# Patient Record
Sex: Female | Born: 1975 | Race: Black or African American | Hispanic: No | Marital: Single | Smoking: Never smoker
Health system: Southern US, Community
[De-identification: ages and names within clinical notes are randomized; demographics above are authoritative.]

## PROBLEM LIST (undated history)

## (undated) ENCOUNTER — Emergency Department (HOSPITAL_COMMUNITY): Admission: EM | Payer: Medicare Other | Source: Home / Self Care

## (undated) DIAGNOSIS — Z993 Dependence on wheelchair: Secondary | ICD-10-CM

## (undated) DIAGNOSIS — R159 Full incontinence of feces: Secondary | ICD-10-CM

## (undated) DIAGNOSIS — G7100 Muscular dystrophy, unspecified: Secondary | ICD-10-CM

## (undated) DIAGNOSIS — N1330 Unspecified hydronephrosis: Secondary | ICD-10-CM

## (undated) DIAGNOSIS — D259 Leiomyoma of uterus, unspecified: Secondary | ICD-10-CM

## (undated) DIAGNOSIS — N939 Abnormal uterine and vaginal bleeding, unspecified: Secondary | ICD-10-CM

## (undated) DIAGNOSIS — G809 Cerebral palsy, unspecified: Secondary | ICD-10-CM

## (undated) DIAGNOSIS — K219 Gastro-esophageal reflux disease without esophagitis: Secondary | ICD-10-CM

## (undated) DIAGNOSIS — E785 Hyperlipidemia, unspecified: Secondary | ICD-10-CM

## (undated) DIAGNOSIS — R32 Unspecified urinary incontinence: Secondary | ICD-10-CM

## (undated) DIAGNOSIS — H409 Unspecified glaucoma: Secondary | ICD-10-CM

## (undated) DIAGNOSIS — R7303 Prediabetes: Secondary | ICD-10-CM

## (undated) DIAGNOSIS — D649 Anemia, unspecified: Secondary | ICD-10-CM

## (undated) DIAGNOSIS — A419 Sepsis, unspecified organism: Secondary | ICD-10-CM

## (undated) DIAGNOSIS — F79 Unspecified intellectual disabilities: Secondary | ICD-10-CM

## (undated) DIAGNOSIS — J189 Pneumonia, unspecified organism: Secondary | ICD-10-CM

## (undated) DIAGNOSIS — Q369 Cleft lip, unilateral: Secondary | ICD-10-CM

## (undated) DIAGNOSIS — R569 Unspecified convulsions: Secondary | ICD-10-CM

## (undated) HISTORY — PX: MULTIPLE TOOTH EXTRACTIONS: SHX2053

## (undated) HISTORY — PX: NASAL SEPTUM SURGERY: SHX37

## (undated) HISTORY — PX: EYE SURGERY: SHX253

## (undated) HISTORY — PX: CLEFT PALATE REPAIR: SUR1165

---

## 2015-07-28 ENCOUNTER — Other Ambulatory Visit (HOSPITAL_COMMUNITY): Payer: Self-pay | Admitting: Specialist

## 2015-07-28 DIAGNOSIS — E270 Other adrenocortical overactivity: Principal | ICD-10-CM

## 2015-07-28 DIAGNOSIS — R7989 Other specified abnormal findings of blood chemistry: Secondary | ICD-10-CM

## 2015-08-05 ENCOUNTER — Encounter (HOSPITAL_COMMUNITY): Payer: Self-pay

## 2015-08-05 ENCOUNTER — Other Ambulatory Visit (HOSPITAL_COMMUNITY): Payer: Self-pay | Admitting: Specialist

## 2015-08-05 ENCOUNTER — Ambulatory Visit (HOSPITAL_COMMUNITY)
Admission: RE | Admit: 2015-08-05 | Discharge: 2015-08-05 | Disposition: A | Discharge status: Home / Self Care | Payer: Medicare Other | Source: Ambulatory Visit | Attending: Specialist | Admitting: Specialist

## 2015-08-05 DIAGNOSIS — E270 Other adrenocortical overactivity: Secondary | ICD-10-CM

## 2015-08-05 DIAGNOSIS — R7989 Other specified abnormal findings of blood chemistry: Secondary | ICD-10-CM

## 2015-08-05 DIAGNOSIS — R509 Fever, unspecified: Secondary | ICD-10-CM

## 2015-08-05 DIAGNOSIS — K8020 Calculus of gallbladder without cholecystitis without obstruction: Secondary | ICD-10-CM | POA: Diagnosis not present

## 2015-08-05 DIAGNOSIS — D3502 Benign neoplasm of left adrenal gland: Secondary | ICD-10-CM | POA: Insufficient documentation

## 2015-08-20 DIAGNOSIS — J189 Pneumonia, unspecified organism: Secondary | ICD-10-CM

## 2015-08-20 HISTORY — DX: Pneumonia, unspecified organism: J18.9

## 2015-08-27 ENCOUNTER — Encounter (HOSPITAL_COMMUNITY): Payer: Self-pay | Admitting: Emergency Medicine

## 2015-08-27 ENCOUNTER — Inpatient Hospital Stay (HOSPITAL_COMMUNITY)
Admission: EM | Admit: 2015-08-27 | Discharge: 2015-08-30 | DRG: 871 | Disposition: A | Discharge status: Home / Self Care | Payer: Medicare Other | Attending: Internal Medicine | Admitting: Internal Medicine

## 2015-08-27 ENCOUNTER — Emergency Department (HOSPITAL_COMMUNITY): Payer: Medicare Other

## 2015-08-27 DIAGNOSIS — D509 Iron deficiency anemia, unspecified: Secondary | ICD-10-CM | POA: Diagnosis present

## 2015-08-27 DIAGNOSIS — Z79899 Other long term (current) drug therapy: Secondary | ICD-10-CM

## 2015-08-27 DIAGNOSIS — N390 Urinary tract infection, site not specified: Secondary | ICD-10-CM | POA: Diagnosis not present

## 2015-08-27 DIAGNOSIS — D3502 Benign neoplasm of left adrenal gland: Secondary | ICD-10-CM | POA: Diagnosis present

## 2015-08-27 DIAGNOSIS — A419 Sepsis, unspecified organism: Principal | ICD-10-CM | POA: Diagnosis present

## 2015-08-27 DIAGNOSIS — E876 Hypokalemia: Secondary | ICD-10-CM | POA: Diagnosis present

## 2015-08-27 DIAGNOSIS — R7303 Prediabetes: Secondary | ICD-10-CM | POA: Diagnosis present

## 2015-08-27 DIAGNOSIS — Z7722 Contact with and (suspected) exposure to environmental tobacco smoke (acute) (chronic): Secondary | ICD-10-CM | POA: Diagnosis present

## 2015-08-27 DIAGNOSIS — N135 Crossing vessel and stricture of ureter without hydronephrosis: Secondary | ICD-10-CM | POA: Diagnosis present

## 2015-08-27 DIAGNOSIS — Z87898 Personal history of other specified conditions: Secondary | ICD-10-CM | POA: Diagnosis not present

## 2015-08-27 DIAGNOSIS — H409 Unspecified glaucoma: Secondary | ICD-10-CM | POA: Diagnosis present

## 2015-08-27 DIAGNOSIS — R509 Fever, unspecified: Secondary | ICD-10-CM

## 2015-08-27 DIAGNOSIS — J9600 Acute respiratory failure, unspecified whether with hypoxia or hypercapnia: Secondary | ICD-10-CM

## 2015-08-27 DIAGNOSIS — E785 Hyperlipidemia, unspecified: Secondary | ICD-10-CM | POA: Diagnosis present

## 2015-08-27 DIAGNOSIS — Q899 Congenital malformation, unspecified: Secondary | ICD-10-CM | POA: Diagnosis not present

## 2015-08-27 DIAGNOSIS — F79 Unspecified intellectual disabilities: Secondary | ICD-10-CM | POA: Diagnosis present

## 2015-08-27 DIAGNOSIS — E538 Deficiency of other specified B group vitamins: Secondary | ICD-10-CM | POA: Diagnosis present

## 2015-08-27 DIAGNOSIS — Z993 Dependence on wheelchair: Secondary | ICD-10-CM | POA: Diagnosis not present

## 2015-08-27 DIAGNOSIS — J9601 Acute respiratory failure with hypoxia: Secondary | ICD-10-CM | POA: Diagnosis present

## 2015-08-27 DIAGNOSIS — R32 Unspecified urinary incontinence: Secondary | ICD-10-CM | POA: Diagnosis present

## 2015-08-27 DIAGNOSIS — Z789 Other specified health status: Secondary | ICD-10-CM | POA: Insufficient documentation

## 2015-08-27 DIAGNOSIS — J189 Pneumonia, unspecified organism: Secondary | ICD-10-CM | POA: Diagnosis present

## 2015-08-27 DIAGNOSIS — R0902 Hypoxemia: Secondary | ICD-10-CM

## 2015-08-27 DIAGNOSIS — G9340 Encephalopathy, unspecified: Secondary | ICD-10-CM | POA: Diagnosis present

## 2015-08-27 DIAGNOSIS — Z87730 Personal history of (corrected) cleft lip and palate: Secondary | ICD-10-CM | POA: Diagnosis not present

## 2015-08-27 HISTORY — DX: Unspecified intellectual disabilities: F79

## 2015-08-27 HISTORY — DX: Unspecified glaucoma: H40.9

## 2015-08-27 LAB — CBC WITH DIFFERENTIAL/PLATELET
BASOS ABS: 0 10*3/uL (ref 0.0–0.1)
Basophils Relative: 0 %
EOS ABS: 0 10*3/uL (ref 0.0–0.7)
Eosinophils Relative: 0 %
HCT: 40.2 % (ref 36.0–46.0)
HEMOGLOBIN: 12.8 g/dL (ref 12.0–15.0)
LYMPHS PCT: 8 %
Lymphs Abs: 1.1 10*3/uL (ref 0.7–4.0)
MCH: 23.4 pg — ABNORMAL LOW (ref 26.0–34.0)
MCHC: 31.8 g/dL (ref 30.0–36.0)
MCV: 73.5 fL — AB (ref 78.0–100.0)
MONOS PCT: 10 %
Monocytes Absolute: 1.4 10*3/uL — ABNORMAL HIGH (ref 0.1–1.0)
NEUTROS ABS: 11 10*3/uL — AB (ref 1.7–7.7)
NEUTROS PCT: 82 %
PLATELETS: ADEQUATE 10*3/uL (ref 150–400)
RBC: 5.47 MIL/uL — AB (ref 3.87–5.11)
RDW: 22.4 % — ABNORMAL HIGH (ref 11.5–15.5)
WBC: 13.5 10*3/uL — ABNORMAL HIGH (ref 4.0–10.5)

## 2015-08-27 LAB — I-STAT CG4 LACTIC ACID, ED: Lactic Acid, Venous: 3.5 mmol/L (ref 0.5–2.0)

## 2015-08-27 LAB — COMPREHENSIVE METABOLIC PANEL
ALBUMIN: 4.4 g/dL (ref 3.5–5.0)
ALT: 34 U/L (ref 14–54)
AST: 45 U/L — AB (ref 15–41)
Alkaline Phosphatase: 95 U/L (ref 38–126)
Anion gap: 12 (ref 5–15)
BUN: 8 mg/dL (ref 6–20)
CHLORIDE: 103 mmol/L (ref 101–111)
CO2: 25 mmol/L (ref 22–32)
CREATININE: 0.96 mg/dL (ref 0.44–1.00)
Calcium: 10 mg/dL (ref 8.9–10.3)
GFR calc Af Amer: >60 mL/min (ref >60)
GLUCOSE: 228 mg/dL — AB (ref 65–99)
POTASSIUM: 4.2 mmol/L (ref 3.5–5.1)
SODIUM: 140 mmol/L (ref 135–145)
Total Bilirubin: 0.9 mg/dL (ref 0.3–1.2)
Total Protein: 8.3 g/dL — ABNORMAL HIGH (ref 6.5–8.1)

## 2015-08-27 MED ORDER — ACETAMINOPHEN 160 MG/5ML PO SOLN
650.0000 mg | Freq: Once | ORAL | Status: AC
  Administered 2015-08-27: 650 mg via ORAL
  Filled 2015-08-27: qty 20.3

## 2015-08-27 MED ORDER — SODIUM CHLORIDE 0.9 % IV BOLUS (SEPSIS)
500.0000 mL | Freq: Once | INTRAVENOUS | Status: AC
  Administered 2015-08-27: 500 mL via INTRAVENOUS

## 2015-08-27 MED ORDER — DEXTROSE 5 % IV SOLN
500.0000 mg | Freq: Once | INTRAVENOUS | Status: AC
  Administered 2015-08-28: 500 mg via INTRAVENOUS
  Filled 2015-08-27: qty 500

## 2015-08-27 MED ORDER — DEXTROSE 5 % IV SOLN
1.0000 g | Freq: Once | INTRAVENOUS | Status: AC
  Administered 2015-08-27: 1 g via INTRAVENOUS
  Filled 2015-08-27: qty 10

## 2015-08-27 NOTE — ED Notes (Signed)
Lactic Acid= 3.50, MD Audie Pinto notified

## 2015-08-27 NOTE — ED Notes (Signed)
MD made aware U bag collection unsuccessful.

## 2015-08-27 NOTE — ED Notes (Addendum)
To hold on blood cultures at this time per Dr. Audie Pinto.  U-bag placed on patient due to unsuccessful catheterization.  MD aware.

## 2015-08-27 NOTE — ED Notes (Addendum)
Per EMS patient here for abdominal pain.   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  Reports that patient's mother spoke to doctor today who stated she needed to come to ER for further evaluation.  Per EMS temp of 101 Tympanic.

## 2015-08-27 NOTE — ED Notes (Signed)
Bed: WA02 Expected date:  Expected time:  Means of arrival:  Comments: EMS 

## 2015-08-27 NOTE — ED Provider Notes (Signed)
CSN: [id number]     Arrival date & time 08/27/15  2020 History   First MD Initiated Contact with Patient 08/27/15 2041     Chief Complaint  Patient presents with  . Abdominal Pain      HPI Patient presents via EMS with a fever.  Patient has past medical history severe birth and congenital defects.. 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 Reports that patient's mother obtained this information from office today and stated office told her to come to ER if patient had fever. Per EMS temp of 101 .  Patient has not been eating or drinking as well recently. Past Medical History  Diagnosis Date  . Glaucoma   . Mental retardation    Past Surgical History  Procedure Laterality Date  . Cleft palate repair    . Eye surgery     History reviewed. No pertinent family history. Social History  Substance Use Topics  . Smoking status: Passive Smoke Exposure - Never Smoker  . Smokeless tobacco: None  . Alcohol Use: None   OB History    No data available     Review of Systems  Unable to perform ROS: Patient nonverbal  Constitutional: Positive for fever.      Allergies  Review of patient's allergies indicates no known allergies.  Home Medications   Prior to Admission medications   Medication Sig Start Date End Date Taking? Authorizing Provider  atropine 1 % ophthalmic solution Place 1 drop into the right eye at bedtime.   Yes Historical Provider, MD  ferrous sulfate 325 (65 FE) MG tablet Take 650 mg by mouth daily with breakfast.   Yes Historical Provider, MD  ibuprofen (ADVIL,MOTRIN) 100 MG/5ML suspension Take by mouth every 4 (four) hours as needed for mild pain. 81mL   Yes Historical Provider, MD  latanoprost (XALATAN) 0.005 % ophthalmic solution Place 1 drop into the left eye at bedtime. 02/18/15  Yes Historical Provider, MD  simvastatin (ZOCOR) 40 MG tablet Take 40 mg by mouth every evening.   Yes Historical Provider, MD  levofloxacin (LEVAQUIN) 500  MG tablet Take 1 tablet (500 mg total) by mouth daily. 08/30/15   Debbe Odea, MD  vitamin B-12 (CYANOCOBALAMIN) 1000 MCG tablet Take 1 tablet (1,000 mcg total) by mouth daily. 08/30/15   Debbe Odea, MD   BP 146/80 mmHg  Pulse 98  Temp(Src) 98.2 F (36.8 C) (Axillary)  Resp 20  Ht 4' (1.219 m)  Wt 65 lb 7.6 oz (29.7 kg)  BMI 19.99 kg/m2  SpO2 99%  LMP 08/13/2015 (Approximate) Physical Exam  Constitutional: No distress.  HENT:  Head: Atraumatic.  Neck: Normal range of motion.  Cardiovascular: Intact distal pulses.  Tachycardia present.   Pulmonary/Chest: No respiratory distress. She has no wheezes.  Abdominal: Normal appearance. She exhibits no distension. There is no tenderness. There is no rebound.  Musculoskeletal: Normal range of motion.  Neurological: She is alert. No cranial nerve deficit.  Skin: Skin is warm and dry. No rash noted.  Nursing note and vitals reviewed.   ED Course  Procedures (including critical care time) Medications  sodium chloride 0.9 % bolus 500 mL (500 mLs Intravenous Transfusing/Transfer 08/28/15 0028)  acetaminophen (TYLENOL) solution 650 mg (650 mg Oral Given 08/27/15 2222)  cefTRIAXone (ROCEPHIN) 1 g in dextrose 5 % 50 mL IVPB (0 g Intravenous Stopped 08/27/15 2353)  azithromycin (ZITHROMAX) 500 mg in dextrose 5 % 250 mL IVPB (500 mg Intravenous Transfusing/Transfer 08/28/15 0028)  sodium chloride  0.9 % bolus 1,000 mL (250 mLs Intravenous Given 08/28/15 0452)  vancomycin (VANCOCIN) 500 mg in sodium chloride 0.9 % 100 mL IVPB (500 mg Intravenous Given 08/28/15 0859)  sodium chloride 0.9 % bolus 1,000 mL (1,000 mLs Intravenous Given 08/28/15 1024)  potassium chloride 10 mEq in 100 mL IVPB (10 mEq Intravenous Given 08/28/15 1507)  vancomycin (VANCOCIN) 500 mg in sodium chloride 0.9 % 100 mL IVPB (500 mg Intravenous Given 08/29/15 0552)  cyanocobalamin ((VITAMIN B-12)) injection 1,000 mcg (1,000 mcg Subcutaneous Given 08/30/15 1007)    Labs Review Labs  Reviewed  CBC WITH DIFFERENTIAL/PLATELET - Abnormal; Notable for the following:    WBC 13.5 (*)    RBC 5.47 (*)    MCV 73.5 (*)    MCH 23.4 (*)    RDW 22.4 (*)    Neutro Abs 11.0 (*)    Monocytes Absolute 1.4 (*)    All other components within normal limits  COMPREHENSIVE METABOLIC PANEL - Abnormal; Notable for the following:    Glucose, Bld 228 (*)    Total Protein 8.3 (*)    AST 45 (*)    All other components within normal limits  LACTIC ACID, PLASMA - Abnormal; Notable for the following:    Lactic Acid, Venous 5.4 (*)    All other components within normal limits  TSH - Abnormal; Notable for the following:    TSH 4.776 (*)    All other components within normal limits  HEMOGLOBIN A1C - Abnormal; Notable for the following:    Hgb A1c MFr Bld 6.3 (*)    All other components within normal limits  COMPREHENSIVE METABOLIC PANEL - Abnormal; Notable for the following:    Potassium 3.4 (*)    Glucose, Bld 214 (*)    All other components within normal limits  CBC - Abnormal; Notable for the following:    WBC 11.4 (*)    Hemoglobin 11.6 (*)    MCV 74.2 (*)    MCH 22.7 (*)    RDW 22.5 (*)    All other components within normal limits  URINALYSIS W MICROSCOPIC (NOT AT [facility]) - Abnormal; Notable for the following:    Hgb urine dipstick TRACE (*)    Leukocytes, UA SMALL (*)    Bacteria, UA MANY (*)    Squamous Epithelial / LPF 0-5 (*)    All other components within normal limits  GLUCOSE, CAPILLARY - Abnormal; Notable for the following:    Glucose-Capillary 120 (*)    All other components within normal limits  T4, FREE - Abnormal; Notable for the following:    Free T4 1.35 (*)    All other components within normal limits  VANCOMYCIN, TROUGH - Abnormal; Notable for the following:    Vancomycin Tr <4 (*)    All other components within normal limits  CBC - Abnormal; Notable for the following:    WBC 10.6 (*)    Hemoglobin 9.9 (*)    HCT 31.7 (*)    MCV 73.0 (*)    MCH 22.8 (*)     RDW 22.3 (*)    All other components within normal limits  BASIC METABOLIC PANEL - Abnormal; Notable for the following:    Glucose, Bld 141 (*)    Calcium 7.7 (*)    All other components within normal limits  GLUCOSE, CAPILLARY - Abnormal; Notable for the following:    Glucose-Capillary 107 (*)    All other components within normal limits  T4, FREE - Abnormal; Notable  for the following:    Free T4 1.51 (*)    All other components within normal limits  VITAMIN B12 - Abnormal; Notable for the following:    Vitamin B-12 118 (*)    All other components within normal limits  IRON AND TIBC - Abnormal; Notable for the following:    Iron 15 (*)    TIBC 230 (*)    Saturation Ratios 7 (*)    All other components within normal limits  GLUCOSE, CAPILLARY - Abnormal; Notable for the following:    Glucose-Capillary 114 (*)    All other components within normal limits  I-STAT CG4 LACTIC ACID, ED - Abnormal; Notable for the following:    Lactic Acid, Venous 3.50 (*)    All other components within normal limits  CULTURE, BLOOD (ROUTINE X 2)  CULTURE, BLOOD (ROUTINE X 2)  URINE CULTURE  MRSA PCR SCREENING  CULTURE, EXPECTORATED SPUTUM-ASSESSMENT  GRAM STAIN  HIV ANTIBODY (ROUTINE TESTING)  STREP PNEUMONIAE URINARY ANTIGEN  LACTIC ACID, PLASMA  PROCALCITONIN  PROTIME-INR  APTT  OCCULT BLOOD X 1 CARD TO LAB, STOOL  INFLUENZA PANEL BY PCR (TYPE A & B, H1N1)  LACTIC ACID, PLASMA  TSH  FOLATE  FERRITIN  RETICULOCYTES  LEGIONELLA ANTIGEN, URINE  I-STAT CG4 LACTIC ACID, ED  I-STAT CG4 LACTIC ACID, ED  CG4 I-STAT (LACTIC ACID)    Imaging Review No results found. I have personally reviewed and evaluated these images and lab results as part of my medical decision-making.  We'll treat patient for community-acquired pneumonia because of radiographic findings.  Hospital is contacted and will come to evaluate for possible admission.  MDM   Final diagnoses:  Fever, unspecified fever  cause  Congenital abnormalities  CAP (community acquired pneumonia)        Leonard Schwartz, MD 08/31/15 1014

## 2015-08-27 NOTE — H&P (Signed)
[facility] History and Physical  Maria [name] [id number] DOB: 1976-04-06 DOA: 08/27/2015  Referring physician: Leonard Schwartz, MD PCP: Pcp Not In System   Chief Complaint: Fever  HPI: Maria [name] is a 39 y.o. female with prior history of MR HLD presents with fevers. 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 She is able to normally feed herself. Patient has been followed at [facility] for her medical problems. She had recently been having fevers and was referred to urology for urinary abnormalities. She had been having abdominal pain and fevers. 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 Urology ordered urine cultures and were going to review the CT for further workup. The mother states that she has not heard back from them yet. She has been having this fever months ago. She has not been treated with any antibiotics. Patient has not been her normal self according to her mother she is normally very easy going and happy person. She called the urologist today and was told to come to the ED because she had fevers. In the ED a CXR was done which shows possible pneumonia and also had an elevation of her lactate. She is being admitted for further treatment and workup.   Review of Systems:  Patient is not able to speak.   Past Medical History  Diagnosis Date  . Glaucoma   . Mental retardation    Past Surgical History  Procedure Laterality Date  . Cleft palate repair    . Eye surgery     Social History:  reports that she has been passively smoking.  She does not have any smokeless tobacco history on file. Her alcohol and drug histories are not on file.  No Known Allergies  History reviewed. No pertinent family history.   Prior to Admission medications   Medication Sig Start Date End Date Taking? Authorizing Provider    atropine 1 % ophthalmic solution Place 1 drop into the right eye at bedtime.   Yes Historical Provider, MD  ferrous sulfate 325 (65 FE) MG tablet Take 650 mg by mouth daily with breakfast.   Yes Historical Provider, MD  ibuprofen (ADVIL,MOTRIN) 100 MG/5ML suspension Take by mouth every 4 (four) hours as needed for mild pain. 76mL   Yes Historical Provider, MD  latanoprost (XALATAN) 0.005 % ophthalmic solution Place 1 drop into the left eye at bedtime. 02/18/15  Yes Historical Provider, MD  simvastatin (ZOCOR) 40 MG tablet Take 40 mg by mouth every evening.   Yes Historical Provider, MD   Physical Exam: Filed Vitals:   08/27/15 2044 08/27/15 2201 08/27/15 2225 08/27/15 2230  BP: 137/76  145/99 141/98  Pulse: 138  130 122  Temp: 100.2 F (37.9 C) 100.2 F (37.9 C)    TempSrc: Axillary Rectal    Resp: 18  20 20   Height: 4' (1.219 m)     Weight: 25.401 kg (56 lb)     SpO2: 100%  100% 100%    Wt Readings from Last 3 Encounters:  08/27/15 25.401 kg (56 lb)    General:  Appears anxious Eyes: unable to assess ENT: unable to assess Neck: no masses or thyromegaly Cardiovascular: RRR, no m/r/g Respiratory:  Normal respiratory effort.no rhonchi Skin: no rash or induration seen on limited exam Musculoskeletal: unable to assess Psychiatric: appears anxious limited by her mental state Neurologic: not able  to assess.          Labs on Admission:  Basic Metabolic Panel:  Recent Labs Lab 08/27/15 2126  NA 140  K 4.2  CL 103  CO2 25  GLUCOSE 228*  BUN 8  CREATININE 0.96  CALCIUM 10.0   Liver Function Tests:  Recent Labs Lab 08/27/15 2126  AST 45*  ALT 34  ALKPHOS 95  BILITOT 0.9  PROT 8.3*  ALBUMIN 4.4   No results for input(s): LIPASE, AMYLASE in the last 168 hours. No results for input(s): AMMONIA in the last 168 hours. CBC:  Recent Labs Lab 08/27/15 2126  WBC 13.5*  NEUTROABS 11.0*  HGB 12.8  HCT 40.2  MCV 73.5*  PLT PLATELET CLUMPS NOTED ON SMEAR, COUNT  APPEARS ADEQUATE   Cardiac Enzymes: No results for input(s): CKTOTAL, CKMB, CKMBINDEX, TROPONINI in the last 168 hours.  BNP (last 3 results) No results for input(s): BNP in the last 8760 hours.  ProBNP (last 3 results) No results for input(s): PROBNP in the last 8760 hours.  CBG: No results for input(s): GLUCAP in the last 168 hours.  Radiological Exams on Admission: Dg Chest Portable 1 View  08/27/2015  CLINICAL DATA:  Acute onset of generalized abdominal pain and fever. Initial encounter. EXAM: PORTABLE CHEST 1 VIEW COMPARISON:  None. FINDINGS: The lungs are mildly hypoexpanded. Mild vascular congestion is noted. Mild bibasilar opacities may reflect atelectasis or possibly mild pneumonia. There is no evidence of pleural effusion or pneumothorax. The cardiomediastinal silhouette is within normal limits. No acute osseous abnormalities are seen. IMPRESSION: Lungs mildly hypoexpanded. Mild vascular congestion noted. Mild bibasilar opacities may reflect atelectasis or possibly mild pneumonia. Electronically Signed   By: Garald Balding M.D.   On: 08/27/2015 22:43      Assessment/Plan Active Problems:   Fever   Sepsis ([place])   Mental retardation   1. Fever possible pneumonia -her CXR shows possible infiltrates will start on empiric antibiotics at this time -will get cultures if possible -will start on empiric therapy with rocephin/azithromycin -oxygen as needed  2. Possible Sepsis -started on sepsis protocol -follow up lactate -check procalcitonin -fluid resuscitation started in the ED -cultures ordered  3. Mental retardation -she is non-communicative  4. HLD -she is on simvastatin -check lipid panel  5. Pre-diabetes -will check A1C -monitor FSBS    Code Status: full code DVT Prophylaxis:heparin Family Communication: mother Disposition Plan: home    [facility] Pager [phone]

## 2015-08-27 NOTE — ED Notes (Signed)
Patient's mother reports she has been crying a lot and not eating.  Reports typically patient laughs a lot and has a good appetite.  Reports patient has not been drinking much either.

## 2015-08-28 ENCOUNTER — Inpatient Hospital Stay (HOSPITAL_COMMUNITY): Payer: Medicare Other

## 2015-08-28 DIAGNOSIS — J9601 Acute respiratory failure with hypoxia: Secondary | ICD-10-CM

## 2015-08-28 DIAGNOSIS — F79 Unspecified intellectual disabilities: Secondary | ICD-10-CM

## 2015-08-28 DIAGNOSIS — J189 Pneumonia, unspecified organism: Secondary | ICD-10-CM

## 2015-08-28 DIAGNOSIS — J9600 Acute respiratory failure, unspecified whether with hypoxia or hypercapnia: Secondary | ICD-10-CM

## 2015-08-28 DIAGNOSIS — Z789 Other specified health status: Secondary | ICD-10-CM | POA: Insufficient documentation

## 2015-08-28 DIAGNOSIS — R509 Fever, unspecified: Secondary | ICD-10-CM

## 2015-08-28 DIAGNOSIS — A419 Sepsis, unspecified organism: Principal | ICD-10-CM

## 2015-08-28 LAB — CBC
HCT: 37.9 % (ref 36.0–46.0)
Hemoglobin: 11.6 g/dL — ABNORMAL LOW (ref 12.0–15.0)
MCH: 22.7 pg — ABNORMAL LOW (ref 26.0–34.0)
MCHC: 30.6 g/dL (ref 30.0–36.0)
MCV: 74.2 fL — AB (ref 78.0–100.0)
PLATELETS: 183 10*3/uL (ref 150–400)
RBC: 5.11 MIL/uL (ref 3.87–5.11)
RDW: 22.5 % — AB (ref 11.5–15.5)
WBC: 11.4 10*3/uL — AB (ref 4.0–10.5)

## 2015-08-28 LAB — COMPREHENSIVE METABOLIC PANEL
ALK PHOS: 88 U/L (ref 38–126)
ALT: 28 U/L (ref 14–54)
AST: 29 U/L (ref 15–41)
Albumin: 3.8 g/dL (ref 3.5–5.0)
Anion gap: 10 (ref 5–15)
BILIRUBIN TOTAL: 0.7 mg/dL (ref 0.3–1.2)
BUN: 6 mg/dL (ref 6–20)
CALCIUM: 9.2 mg/dL (ref 8.9–10.3)
CHLORIDE: 104 mmol/L (ref 101–111)
CO2: 26 mmol/L (ref 22–32)
CREATININE: 0.78 mg/dL (ref 0.44–1.00)
GFR calc Af Amer: >60 mL/min (ref >60)
Glucose, Bld: 214 mg/dL — ABNORMAL HIGH (ref 65–99)
Potassium: 3.4 mmol/L — ABNORMAL LOW (ref 3.5–5.1)
Sodium: 140 mmol/L (ref 135–145)
Total Protein: 7.6 g/dL (ref 6.5–8.1)

## 2015-08-28 LAB — PROTIME-INR
INR: 1.12 (ref 0.00–1.49)
PROTHROMBIN TIME: 14.6 s (ref 11.6–15.2)

## 2015-08-28 LAB — PROCALCITONIN: PROCALCITONIN: 0.18 ng/mL

## 2015-08-28 LAB — LACTIC ACID, PLASMA
LACTIC ACID, VENOUS: 1.6 mmol/L (ref 0.5–2.0)
LACTIC ACID, VENOUS: 5.4 mmol/L — AB (ref 0.5–2.0)
Lactic Acid, Venous: 1 mmol/L (ref 0.5–2.0)

## 2015-08-28 LAB — APTT: aPTT: 27 seconds (ref 24–37)

## 2015-08-28 LAB — URINALYSIS W MICROSCOPIC (NOT AT ARMC)
Bilirubin Urine: NEGATIVE
Glucose, UA: NEGATIVE mg/dL
Ketones, ur: NEGATIVE mg/dL
Nitrite: NEGATIVE
PH: 5 (ref 5.0–8.0)
Protein, ur: NEGATIVE mg/dL
SPECIFIC GRAVITY, URINE: 1.009 (ref 1.005–1.030)

## 2015-08-28 LAB — GLUCOSE, CAPILLARY: GLUCOSE-CAPILLARY: 120 mg/dL — AB (ref 65–99)

## 2015-08-28 LAB — CG4 I-STAT (LACTIC ACID): Lactic Acid, Venous: 1.97 mmol/L (ref 0.5–2.0)

## 2015-08-28 LAB — HIV ANTIBODY (ROUTINE TESTING W REFLEX): HIV Screen 4th Generation wRfx: NONREACTIVE

## 2015-08-28 LAB — T4, FREE: Free T4: 1.35 ng/dL — ABNORMAL HIGH (ref 0.61–1.12)

## 2015-08-28 LAB — MRSA PCR SCREENING: MRSA BY PCR: NEGATIVE

## 2015-08-28 LAB — STREP PNEUMONIAE URINARY ANTIGEN: STREP PNEUMO URINARY ANTIGEN: NEGATIVE

## 2015-08-28 LAB — TSH: TSH: 4.776 u[IU]/mL — AB (ref 0.350–4.500)

## 2015-08-28 MED ORDER — ONDANSETRON HCL 4 MG/2ML IJ SOLN: 4.0000 mg | Freq: Four times a day (QID) | INTRAMUSCULAR | Status: DC | PRN

## 2015-08-28 MED ORDER — ENSURE ENLIVE PO LIQD: 237.0000 mL | Freq: Every morning | ORAL | Status: DC

## 2015-08-28 MED ORDER — SODIUM CHLORIDE 0.9 % IV BOLUS (SEPSIS)
1000.0000 mL | Freq: Once | INTRAVENOUS | Status: AC
  Administered 2015-08-28: 1000 mL via INTRAVENOUS

## 2015-08-28 MED ORDER — ATROPINE SULFATE 1 % OP SOLN
1.0000 [drp] | Freq: Every day | OPHTHALMIC | Status: DC
  Administered 2015-08-28 – 2015-08-29 (×3): 1 [drp] via OPHTHALMIC
  Filled 2015-08-28: qty 2

## 2015-08-28 MED ORDER — SODIUM CHLORIDE 0.9 % IV BOLUS (SEPSIS)
1000.0000 mL | Freq: Once | INTRAVENOUS | Status: AC
  Administered 2015-08-28: 250 mL via INTRAVENOUS

## 2015-08-28 MED ORDER — POTASSIUM CHLORIDE 10 MEQ/100ML IV SOLN
10.0000 meq | INTRAVENOUS | Status: AC
  Administered 2015-08-28 (×4): 10 meq via INTRAVENOUS
  Filled 2015-08-28 (×4): qty 100

## 2015-08-28 MED ORDER — DEXTROSE 5 % IV SOLN: 500.0000 mg | Freq: Every day | INTRAVENOUS | Status: DC

## 2015-08-28 MED ORDER — SIMVASTATIN 40 MG PO TABS
40.0000 mg | ORAL_TABLET | Freq: Every evening | ORAL | Status: DC
  Filled 2015-08-28: qty 1

## 2015-08-28 MED ORDER — ACETAMINOPHEN 325 MG PO TABS: 650.0000 mg | ORAL_TABLET | Freq: Four times a day (QID) | ORAL | Status: DC | PRN

## 2015-08-28 MED ORDER — FERROUS SULFATE 325 (65 FE) MG PO TABS
650.0000 mg | ORAL_TABLET | Freq: Every day | ORAL | Status: DC
  Filled 2015-08-28 (×2): qty 2

## 2015-08-28 MED ORDER — PIPERACILLIN-TAZOBACTAM IN DEX 2-0.25 GM/50ML IV SOLN
2.2500 g | Freq: Three times a day (TID) | INTRAVENOUS | Status: DC
  Administered 2015-08-28 (×2): 2.25 g via INTRAVENOUS
  Filled 2015-08-28 (×4): qty 50

## 2015-08-28 MED ORDER — ONDANSETRON HCL 4 MG PO TABS: 4.0000 mg | ORAL_TABLET | Freq: Four times a day (QID) | ORAL | Status: DC | PRN

## 2015-08-28 MED ORDER — ACETAMINOPHEN 650 MG RE SUPP
650.0000 mg | Freq: Four times a day (QID) | RECTAL | Status: DC | PRN
  Administered 2015-08-28: 650 mg via RECTAL
  Filled 2015-08-28: qty 1

## 2015-08-28 MED ORDER — DEXTROSE 5 % IV SOLN
2.0000 g | Freq: Every day | INTRAVENOUS | Status: DC
  Filled 2015-08-28: qty 2

## 2015-08-28 MED ORDER — VANCOMYCIN HCL 500 MG IV SOLR
500.0000 mg | Freq: Once | INTRAVENOUS | Status: AC
  Administered 2015-08-28: 500 mg via INTRAVENOUS
  Filled 2015-08-28: qty 500

## 2015-08-28 MED ORDER — DEXTROSE 5 % IV SOLN
250.0000 mg | Freq: Every day | INTRAVENOUS | Status: DC
  Administered 2015-08-28: 250 mg via INTRAVENOUS
  Filled 2015-08-28: qty 250

## 2015-08-28 MED ORDER — SODIUM CHLORIDE 0.9 % IV SOLN
INTRAVENOUS | Status: DC
Start: 2015-08-28 — End: 2015-08-30
  Administered 2015-08-28 – 2015-08-30 (×4): via INTRAVENOUS

## 2015-08-28 MED ORDER — IBUPROFEN 100 MG/5ML PO SUSP
400.0000 mg | ORAL | Status: DC | PRN
  Filled 2015-08-28: qty 20

## 2015-08-28 MED ORDER — PIPERACILLIN-TAZOBACTAM 3.375 G IVPB
3.3750 g | Freq: Three times a day (TID) | INTRAVENOUS | Status: DC
  Administered 2015-08-28: 3.375 g via INTRAVENOUS
  Filled 2015-08-28 (×2): qty 50

## 2015-08-28 MED ORDER — HEPARIN SODIUM (PORCINE) 5000 UNIT/ML IJ SOLN
5000.0000 [IU] | Freq: Three times a day (TID) | INTRAMUSCULAR | Status: DC
  Administered 2015-08-28 – 2015-08-30 (×8): 5000 [IU] via SUBCUTANEOUS
  Filled 2015-08-28 (×10): qty 1

## 2015-08-28 MED ORDER — SODIUM CHLORIDE 0.9 % IV BOLUS (SEPSIS): 250.0000 mL | Freq: Once | INTRAVENOUS | Status: DC

## 2015-08-28 MED ORDER — LATANOPROST 0.005 % OP SOLN
1.0000 [drp] | Freq: Every day | OPHTHALMIC | Status: DC
  Administered 2015-08-28 – 2015-08-29 (×3): 1 [drp] via OPHTHALMIC
  Filled 2015-08-28: qty 2.5

## 2015-08-28 NOTE — Procedures (Signed)
Central Venous Catheter Insertion Procedure Note Maria Novelo [id number] 1976-05-12  Procedure: Insertion of Central Venous Catheter Indications: Drug and/or fluid administration and Frequent blood sampling  Procedure Details Consent: Risks of procedure as well as the alternatives and risks of each were explained to the (patient/caregiver).  Consent for procedure obtained. Time Out: Verified patient identification, verified procedure, site/side was marked, verified correct patient position, special equipment/implants available, medications/allergies/relevent history reviewed, required imaging and test results available.  Performed Left femoral vein identified via real time [place]  Maximum sterile technique was used including antiseptics, cap, gloves, gown, hand hygiene, mask and sheet. Skin prep: Chlorhexidine; local anesthetic administered A antimicrobial bonded/coated triple lumen catheter was placed in the left femoral vein due to multiple attempts, no other available access using the Seldinger technique.  Evaluation Blood flow good Complications: No apparent complications Patient did tolerate procedure well. Chest X-ray ordered to verify placement.  CXR: n/a .  Clementeen Graham 08/28/2015, 11:59 AM

## 2015-08-28 NOTE — Progress Notes (Signed)
ANTIBIOTIC CONSULT NOTE - INITIAL  Pharmacy Consult for Vancomycin Indication: rule out sepsis  No Known Allergies  Patient Measurements: Height: 4' (121.9 cm) Weight: 56 lb (25.401 kg) IBW/kg (Calculated) : 17.9  Vital Signs: Temp: 98.8 F (37.1 C) (12/09 0741) Temp Source: Rectal (12/08 2201) BP: 115/90 mmHg (12/09 0800) Pulse Rate: 105 (12/09 0800) Intake/Output from previous day: 12/08 0701 - 12/09 0700 In: 100 [IV Piggyback:100] Out: -  Intake/Output from this shift: Total I/O In: 900 [IV Piggyback:900] Out: -   Labs:  Recent Labs  08/27/15 2126 08/28/15 0255  WBC 13.5* 11.4*  HGB 12.8 11.6*  PLT PLATELET CLUMPS NOTED ON SMEAR, COUNT APPEARS ADEQUATE 183  CREATININE 0.96 0.78   Estimated Creatinine Clearance: 30.8 mL/min (by C-G formula based on Cr of 0.78). No results for input(s): VANCOTROUGH, VANCOPEAK, VANCORANDOM, GENTTROUGH, GENTPEAK, GENTRANDOM, TOBRATROUGH, TOBRAPEAK, TOBRARND, AMIKACINPEAK, AMIKACINTROU, AMIKACIN in the last 72 hours.   Microbiology: No results found for this or any previous visit (from the past 720 hour(s)).  Medical History: Past Medical History  Diagnosis Date  . Glaucoma   . Mental retardation    Medications:  Scheduled:  . atropine  1 drop Right Eye QHS  . azithromycin  500 mg Intravenous QHS  . ferrous sulfate  650 mg Oral Q breakfast  . heparin  5,000 Units Subcutaneous 3 times per day  . latanoprost  1 drop Left Eye QHS  . piperacillin-tazobactam (ZOSYN)  IV  3.375 g Intravenous Q8H  . simvastatin  40 mg Oral QPM  . sodium chloride  250 mL Intravenous Once  . vancomycin  500 mg Intravenous Once   Anti-infectives    Start     Dose/Rate Route Frequency Ordered Stop   08/28/15 2200  azithromycin (ZITHROMAX) 500 mg in dextrose 5 % 250 mL IVPB     500 mg 250 mL/hr over 60 Minutes Intravenous Daily at bedtime 08/28/15 0157     08/28/15 1000  cefTRIAXone (ROCEPHIN) 2 g in dextrose 5 % 50 mL IVPB  Status:  Discontinued      2 g 100 mL/hr over 30 Minutes Intravenous Daily 08/28/15 0157 08/28/15 0759   08/28/15 0900  vancomycin (VANCOCIN) 500 mg in sodium chloride 0.9 % 100 mL IVPB     500 mg 100 mL/hr over 60 Minutes Intravenous  Once 08/28/15 0819     08/28/15 0800  piperacillin-tazobactam (ZOSYN) IVPB 3.375 g     3.375 g 12.5 mL/hr over 240 Minutes Intravenous Every 8 hours 08/28/15 0739     08/27/15 2300  cefTRIAXone (ROCEPHIN) 1 g in dextrose 5 % 50 mL IVPB     1 g 100 mL/hr over 30 Minutes Intravenous  Once 08/27/15 2257 08/27/15 2353   08/27/15 2300  azithromycin (ZITHROMAX) 500 mg in dextrose 5 % 250 mL IVPB     500 mg 250 mL/hr over 60 Minutes Intravenous  Once 08/27/15 2257 08/28/15 0109     Assessment: Patient with mental retardation and (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  Pt with fever in ED.  MD notes prior urology workup but per family unresolved, fevers for months.  CXR in ED shows possible PNA.  Elevated Lactic acid, transfer to ICU, d/c Rocephin and begin Vancomycin and Zosyn, pharmacy to dose.  Unable to obtain labs via peripheral stick, placing Femoral line  Discussed abx dosing with Pediatric Pharmacist at [facility]. Continuing Azithromycin along with Vanc and Zosyn, U/A negative, rule out PNA, rule out sepsis.  12/8 Rocephin >> 12/9 12/8 Azithromycin >>  12/9 Vanc >> 12/9 Zosyn >>  Microbiology Results: 12/9 BCx: collected after abx begun 12/9 UCx: collected after abx begun 12/ MRSA PCR: sent  12/9 HIV Ab:  Strep pneumo/Legionella: ordered after abx begun  Goal of Therapy:  Vancomycin trough level 15-20 mcg/ml  Plan:   Vancomycin 500mg  x1, subsequent doses per levels  Change Zosyn to 2.25gm q8hr, dosing per pediatric guidelines  Reduce Azithromycin to 250mg  q24hr - continue for now with negative U/A  Minda Ditto PharmD Pager [phone] 08/28/2015, 12:13 PM

## 2015-08-28 NOTE — Progress Notes (Signed)
ANTIBIOTIC CONSULT NOTE - INITIAL  Pharmacy Consult for Azithromycin, ceftriaxone Indication: CAP  No Known Allergies  Patient Measurements: Height: 4' (121.9 cm) Weight: 56 lb (25.401 kg) IBW/kg (Calculated) : 17.9 Adjusted Body Weight:   Vital Signs: Temp: 100.2 F (37.9 C) (12/08 2201) Temp Source: Rectal (12/08 2201) BP: 114/85 mmHg (12/09 0105) Pulse Rate: 117 (12/09 0105) Intake/Output from previous day:   Intake/Output from this shift:    Labs:  Recent Labs  08/27/15 2126  WBC 13.5*  HGB 12.8  PLT PLATELET CLUMPS NOTED ON SMEAR, COUNT APPEARS ADEQUATE  CREATININE 0.96   Estimated Creatinine Clearance: 25.7 mL/min (by C-G formula based on Cr of 0.96). No results for input(s): VANCOTROUGH, VANCOPEAK, VANCORANDOM, GENTTROUGH, GENTPEAK, GENTRANDOM, TOBRATROUGH, TOBRAPEAK, TOBRARND, AMIKACINPEAK, AMIKACINTROU, AMIKACIN in the last 72 hours.   Microbiology: No results found for this or any previous visit (from the past 720 hour(s)).  Medical History: Past Medical History  Diagnosis Date  . Glaucoma   . Mental retardation     Medications:  Anti-infectives    Start     Dose/Rate Route Frequency Ordered Stop   08/28/15 2200  azithromycin (ZITHROMAX) 500 mg in dextrose 5 % 250 mL IVPB     500 mg 250 mL/hr over 60 Minutes Intravenous Daily at bedtime 08/28/15 0157     08/28/15 1000  cefTRIAXone (ROCEPHIN) 2 g in dextrose 5 % 50 mL IVPB     2 g 100 mL/hr over 30 Minutes Intravenous Daily 08/28/15 0157     08/27/15 2300  cefTRIAXone (ROCEPHIN) 1 g in dextrose 5 % 50 mL IVPB     1 g 100 mL/hr over 30 Minutes Intravenous  Once 08/27/15 2257 08/27/15 2353   08/27/15 2300  azithromycin (ZITHROMAX) 500 mg in dextrose 5 % 250 mL IVPB     500 mg 250 mL/hr over 60 Minutes Intravenous  Once 08/27/15 2257 08/28/15 0109     Assessment: Patient with CAP.  First dose of antibiotics already given.    Goal of Therapy:  Rocephin/azithromycin based on manufacturer dosing  recommendations.  Plan: Ceftriaxone 2gm iv q24hr Azithromycin 500mg  iv q24hr  Follow up culture results  Nani Skillern Crowford 08/28/2015,3:28 AM

## 2015-08-28 NOTE — Progress Notes (Signed)
RT attempted to obtain ABG sample without success. RN aware. RT will continue to monitor.

## 2015-08-28 NOTE — Care Management Note (Signed)
Case Management Note  Patient Details  Name: Avagrace Zaldana MRN: [id number] Date of Birth: [birth year]  Subjective/Objective:         abd pain and sepsis           Action/Plan:Date: August 28, 2015 Chart reviewed for concurrent status and case management needs. Will continue to follow patient for changes and needs: Velva Harman, RN, BSN, [place]   [phone]   Expected Discharge Date:                  Expected Discharge Plan:  Home/Self Care  In-House Referral:  NA  Discharge planning Services     Post Acute Care Choice:  NA Choice offered to:  NA  DME Arranged:  N/A DME Agency:  NA  HH Arranged:  NA HH Agency:  NA  Status of Service:  In process, will continue to follow  Medicare Important Message Given:    Date Medicare IM Given:    Medicare IM give by:    Date Additional Medicare IM Given:    Additional Medicare Important Message give by:     If discussed at [place] of Stay Meetings, dates discussed:    Additional Comments:  Leeroy Cha, RN 08/28/2015, 10:42 AM

## 2015-08-28 NOTE — Progress Notes (Signed)
Respiratory and Lab unable to draw any labs from pt. MD aware.

## 2015-08-28 NOTE — Progress Notes (Signed)
Spoke to [place], Therapist, sports about placing [employer]. At this time I feel femoral catheter would be best option, if patient requires long term IV, could assess for PICC line at a later date.

## 2015-08-28 NOTE — Evaluation (Signed)
Clinical/Bedside Swallow Evaluation Patient Details  Name: Maria [name] MRN: [id number] Date of Birth: [birth year]  Today's Date: 08/28/2015 Time: SLP Start Time (ACUTE ONLY): 1519 SLP Stop Time (ACUTE ONLY): 1533 SLP Time Calculation (min) (ACUTE ONLY): 14 min  Past Medical History:  Past Medical History  Diagnosis Date  . Glaucoma   . Mental retardation    Past Surgical History:  Past Surgical History  Procedure Laterality Date  . Cleft palate repair    . Eye surgery     HPI:  39 y.o. female who recently moved here with her mom (who is caregiver) from [place] to be closer to family. 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 She is in a wheelchair, but can crawl and feed herself.  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 Pt admitted 12/8 with fever; cxr  showed possible PNA.  Pt non-verbal; vocalizes, follows some commands and is alert. Preadmission diet pureed.   Assessment / Plan / Recommendation Clinical Impression  Pt presents with functional swallowing.  Despite being nonverbal, she readily anticipates approach of cup and spoon to her lips; purposely manipulates POs (purees/liquids) with good oral seal; activates consistent swallow response with no overt s/s of aspiration.  Pt vocalizes intermittently throughout assessment.  Phonation is clear, not wet/congested. She appears to turn her head away when she no longer wants POs.  Recommend resuming a dysphagia 1/pureed diet; allow thin liquids; give meds crushed in puree.  Provide careful hand-feeding and respond to patient's nonverbal cues as needed.  No further SLP f/u is warranted.     Aspiration Risk  Risk for inadequate nutrition/hydration    Diet Recommendation     Medication Administration: Crushed with puree    Other  Recommendations Oral Care Recommendations: Oral care BID    Follow up Recommendations  None            Swallow Study   General HPI: 39 y.o. female who recently moved here with her mom (who is caregiver) from [place] to be closer to family. 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 She is in a wheelchair, but can crawl and feed herself.  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 Pt admitted 12/8 with fever; cxr  showed possible PNA.  Pt non-verbal; vocalizes, follows some commands and is alert. Preadmission diet pureed. Type of Study: Bedside Swallow Evaluation Previous Swallow Assessment: none per records Diet Prior to this Study: NPO Temperature Spikes Noted: Yes Respiratory Status: Room air History of Recent Intubation: No Behavior/Cognition: Alert Oral Cavity Assessment: Within Functional Limits Oral Cavity - Dentition: Edentulous Vision:  (unable to assess) Self-Feeding Abilities: Total assist Patient Positioning: Upright in bed Baseline Vocal Quality: Low vocal intensity Volitional Cough: Cognitively unable to elicit Volitional Swallow: Unable to elicit    Oral/Motor/Sensory Function Overall Oral Motor/Sensory Function: Within functional limits   Ice Chips Ice chips: Not tested   Thin Liquid Thin Liquid: Within functional limits Presentation: Cup    Nectar Thick Nectar Thick Liquid: Not tested   Honey Thick Honey Thick Liquid: Not tested   Puree Puree: Within functional limits   Solid Solid: Not tested       Juan Quam Laurice 08/28/2015,3:43 PM

## 2015-08-28 NOTE — Progress Notes (Signed)
[facility] Progress Note   Maria [name]  [id number]  DOB: 10-26-1975  DOA: 08/27/2015 PCP: Pcp Not In System  Brief narrative: Maria [name] is a 39 y.o. 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 She presents to the hospital for fevers. She is followed by no one for her chronic medical issues. She was recently referred to a urologist after she developed abdominal pain and bloating. A CT that was performed to workup her symptoms revealed right extrarenal pelvis with dilute patient of the proximal right ureter and abrupt narrowing of the proximal/mid ureter. No mass was seen. When she was evaluated by the urologist yesterday. The history in regards to fevers was obtained and temperature at that time was noted to be 101 tympanic. A UA and urine culture were obtained and the urologist referred her to the ER for further workup. In the ER and x-ray revealed possible bibasilar infiltrates in bilateral lungs. Her oxygen saturation was 99-100% on room air  Subjective: Nonverbal. Rapid response called this morning due to tachycardia and elevated lactic acid.  Assessment/Plan: Principal Problem:   Sepsis/ hypoxic resp failure/ acute encephalopathy - chest x-ray repeated this morning reveals bibasilar opacities- Pulse ox, which was normal on room air yesterday is low today and she is currently requiring 3 L of O2 -she is incontinent of urine- UA was obtained this AM after a pediatric foley cath was placed- it is negative for infection - she was started on Rocephin and Zithromax and I have transitioned her to Vanc, Zosyn - will continueZithromax - strep Pneumo antigen negative - keep NPO until more alert - staff not able to draw blood today-  Central line placed (femoral)- has a very difficult anatomy and small veins which prevent PICC, IJ and subclavian lines - follow Lactic acid  levels  Active Problems:  Hypokalemia - mild- replace via IV    Developmental delay - non-verbal, incontinent and wheelchair bound- diet consists of pureed foods    Code Status:     Code Status Orders        Start     Ordered   08/28/15 0157  Full code   Continuous     08/28/15 0157    Advance Directive Documentation        Most Recent Value   Type of Advance Directive  Healthcare Power of Attorney   Pre-existing out of facility DNR order (yellow form or pink MOST form)     "MOST" Form in Place?       Family Communication:   Disposition Plan: follow in ICU/ SDU DVT prophylaxis: Heparin Consultants: Procedures: Central line   Antibiotics: Anti-infectives    Start     Dose/Rate Route Frequency Ordered Stop   08/28/15 2200  azithromycin (ZITHROMAX) 500 mg in dextrose 5 % 250 mL IVPB  Status:  Discontinued     500 mg 250 mL/hr over 60 Minutes Intravenous Daily at bedtime 08/28/15 0157 08/28/15 0923   08/28/15 2200  azithromycin (ZITHROMAX) 250 mg in dextrose 5 % 125 mL IVPB     250 mg 125 mL/hr over 60 Minutes Intravenous Daily at bedtime 08/28/15 0923     08/28/15 1600  piperacillin-tazobactam (ZOSYN) IVPB 2.25 g     2.25 g 100 mL/hr over 30 Minutes Intravenous Every 8 hours 08/28/15 0919     08/28/15 1000  cefTRIAXone (ROCEPHIN) 2 g in dextrose 5 % 50 mL IVPB  Status:  Discontinued     2 g 100 mL/hr over 30 Minutes Intravenous Daily 08/28/15 0157 08/28/15 0759   08/28/15 0900  vancomycin (VANCOCIN) 500 mg in sodium chloride 0.9 % 100 mL IVPB     500 mg 100 mL/hr over 60 Minutes Intravenous  Once 08/28/15 0819 08/28/15 0959   08/28/15 0800  piperacillin-tazobactam (ZOSYN) IVPB 3.375 g  Status:  Discontinued     3.375 g 12.5 mL/hr over 240 Minutes Intravenous Every 8 hours 08/28/15 0739 08/28/15 0919   08/27/15 2300  cefTRIAXone (ROCEPHIN) 1 g in dextrose 5 % 50 mL IVPB     1 g 100 mL/hr over 30 Minutes Intravenous  Once 08/27/15 2257 08/27/15 2353   08/27/15  2300  azithromycin (ZITHROMAX) 500 mg in dextrose 5 % 250 mL IVPB     500 mg 250 mL/hr over 60 Minutes Intravenous  Once 08/27/15 2257 08/28/15 0109      Objective: Filed Weights   08/27/15 2044  Weight: 25.401 kg (56 lb)    Intake/Output Summary (Last 24 hours) at 08/28/15 1144 Last data filed at 08/28/15 0800  Gross per 24 hour  Intake   1000 ml  Output      0 ml  Net   1000 ml     Vitals Filed Vitals:   08/28/15 0800 08/28/15 0900 08/28/15 1000 08/28/15 1100  BP: 115/90 97/57 114/98 126/96  Pulse: 105 108    Temp:  97.8 F (36.6 C)    TempSrc:  Axillary    Resp: 34 19 25 23   Height:      Weight:      SpO2: 99% 100%      Exam:  General:  Pt is alert, not in acute distress  HEENT: No icterus, No thrush, oral mucosa moist  Cardiovascular: regular rate and rhythm, S1/S2 No murmur  Respiratory: clear to auscultation bilaterally   Abdomen: Soft, +Bowel sounds, non tender, non distended, no guarding  MSK: No LE edema, cyanosis or clubbing  Data Reviewed: Basic Metabolic Panel:  Recent Labs Lab 08/27/15 2126 08/28/15 0255  NA 140 140  K 4.2 3.4*  CL 103 104  CO2 25 26  GLUCOSE 228* 214*  BUN 8 6  CREATININE 0.96 0.78  CALCIUM 10.0 9.2   Liver Function Tests:  Recent Labs Lab 08/27/15 2126 08/28/15 0255  AST 45* 29  ALT 34 28  ALKPHOS 95 88  BILITOT 0.9 0.7  PROT 8.3* 7.6  ALBUMIN 4.4 3.8   No results for input(s): LIPASE, AMYLASE in the last 168 hours. No results for input(s): AMMONIA in the last 168 hours. CBC:  Recent Labs Lab 08/27/15 2126 08/28/15 0255  WBC 13.5* 11.4*  NEUTROABS 11.0*  --   HGB 12.8 11.6*  HCT 40.2 37.9  MCV 73.5* 74.2*  PLT PLATELET CLUMPS NOTED ON SMEAR, COUNT APPEARS ADEQUATE 183   Cardiac Enzymes: No results for input(s): CKTOTAL, CKMB, CKMBINDEX, TROPONINI in the last 168 hours. BNP (last 3 results) No results for input(s): BNP in the last 8760 hours.  ProBNP (last 3 results) No results for  input(s): PROBNP in the last 8760 hours.  CBG:  Recent Labs Lab 08/28/15 0907  GLUCAP 120*    No results found for this or any previous visit (from the past 240 hour(s)).   Studies: Dg Chest Port 1 View  08/28/2015  CLINICAL DATA:  Abdomen pain with fever and shortness of breath. EXAM: PORTABLE CHEST 1 VIEW COMPARISON:  08/27/2015. FINDINGS: Cardiomegaly.  Improved lung volumes. Slight opacity at at both bases could represent infiltrate or atelectasis similar to yesterday's radiograph. No acute osseous findings. IMPRESSION: Stable exam.  Indeterminate bibasilar densities. Electronically Signed   By: Staci Righter M.D.   On: 08/28/2015 08:37   Dg Chest Portable 1 View  08/27/2015  CLINICAL DATA:  Acute onset of generalized abdominal pain and fever. Initial encounter. EXAM: PORTABLE CHEST 1 VIEW COMPARISON:  None. FINDINGS: The lungs are mildly hypoexpanded. Mild vascular congestion is noted. Mild bibasilar opacities may reflect atelectasis or possibly mild pneumonia. There is no evidence of pleural effusion or pneumothorax. The cardiomediastinal silhouette is within normal limits. No acute osseous abnormalities are seen. IMPRESSION: Lungs mildly hypoexpanded. Mild vascular congestion noted. Mild bibasilar opacities may reflect atelectasis or possibly mild pneumonia. Electronically Signed   By: Garald Balding M.D.   On: 08/27/2015 22:43    Scheduled Meds:  Scheduled Meds: . atropine  1 drop Right Eye QHS  . azithromycin  250 mg Intravenous QHS  . feeding supplement (ENSURE ENLIVE)  237 mL Oral q morning - 10a  . ferrous sulfate  650 mg Oral Q breakfast  . heparin  5,000 Units Subcutaneous 3 times per day  . latanoprost  1 drop Left Eye QHS  . piperacillin-tazobactam (ZOSYN)  IV  2.25 g Intravenous Q8H  . simvastatin  40 mg Oral QPM  . sodium chloride  250 mL Intravenous Once   Continuous Infusions: . sodium chloride 75 mL/hr at 08/28/15 0831    Time spent on care of this patient:  90 min   [place], MD 08/28/2015, 11:44 AM  LOS: 1 day   [facility] Office  [phone] Pager - Text Page per [email or web address] If 7PM-7AM, please contact night-coverage [email or web address]

## 2015-08-28 NOTE — Progress Notes (Signed)
Lab called w/ critical value for lactic acid- 5.4.  Previous lactic acid was 1.6.  Text paged L [place].

## 2015-08-28 NOTE — Progress Notes (Addendum)
Initial Nutrition Assessment  DOCUMENTATION CODES:   Underweight  INTERVENTION:  - Will order Ensure Enlive, this supplement provides 350 kcal and 20 grams of protein - Provide feeding assistance as needed with meals - RD will continue to monitor for needs  NUTRITION DIAGNOSIS:   Inadequate oral intake related to acute illness, poor appetite as evidenced by per patient/family report.  GOAL:   Patient will meet greater than or equal to 90% of their needs  MONITOR:   PO intake, Supplement acceptance, Weight trends, Labs, Skin, I & O's  REASON FOR ASSESSMENT:   Other (Comment) (Underweight BMI)  ASSESSMENT:   39 y.o. female with prior history of MR HLD presents with fevers. 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 She is able to normally feed herself. Patient has been followed at [facility] for her medical problems. She had recently been having fevers and was referred to urology for urinary abnormalities. She had been having abdominal pain and fevers. 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 Urology ordered urine cultures and were going to review the CT for further workup. The mother states that she has not heard back from them yet. She has been having this fever months ago. She has not been treated with any antibiotics. Patient has not been her normal self according to her mother she is normally very easy going and happy person. She called the urologist today and was told to come to the ED because she had fevers. In the ED a CXR was done which shows possible pneumonia and also had an elevation of her lactate.  Pt seen for underweight BMI. Pt is non-communicative at baseline and no family/visitors present at time of RD visit. Notes indicate that at baseline pt is able to feed herself. Notes also indicate that pt has had a poor  appetite with decreased intakes recently which is unusual for her. No intakes documented. Nutrition needs increased given ongoing fever.  Physical assessment shows mild to moderate muscle wasting, no fat wasting, no edema. Difficult to assess given height and contractures, likely related to growth retardation.   Unsure if pt is meeting nutrition needs at this time. Will order Ensure once/day to supplement in the event that pt is eating very poorly PO. Medications reviewed. Labs reviewed; K: 3.4 mmol/L.  ADDENDUM: No previous weight recordings available in pt's chart for comparison to CBW.   Diet Order:  Diet NPO time specified  Skin:  Reviewed, no issues  Last BM:  PTA  Height:   Ht Readings from Last 1 Encounters:  08/27/15 4' (1.219 m)    Weight:   Wt Readings from Last 1 Encounters:  08/27/15 56 lb (25.401 kg)    Ideal Body Weight:  34.54 kg (kg)  BMI:  Body mass index is 17.09 kg/(m^2).  Estimated Nutritional Needs:   Kcal:  409 110 1939  Protein:  35-45 grams  Fluid:  1.6 L/day  EDUCATION NEEDS:   No education needs identified at this time     Jarome Matin, RD, LDN Inpatient Clinical Dietitian Pager # [phone] After hours/weekend pager # [phone]

## 2015-08-29 DIAGNOSIS — Z87898 Personal history of other specified conditions: Secondary | ICD-10-CM

## 2015-08-29 LAB — CBC
HCT: 31.7 % — ABNORMAL LOW (ref 36.0–46.0)
Hemoglobin: 9.9 g/dL — ABNORMAL LOW (ref 12.0–15.0)
MCH: 22.8 pg — ABNORMAL LOW (ref 26.0–34.0)
MCHC: 31.2 g/dL (ref 30.0–36.0)
MCV: 73 fL — ABNORMAL LOW (ref 78.0–100.0)
Platelets: 182 10*3/uL (ref 150–400)
RBC: 4.34 MIL/uL (ref 3.87–5.11)
RDW: 22.3 % — AB (ref 11.5–15.5)
WBC: 10.6 10*3/uL — ABNORMAL HIGH (ref 4.0–10.5)

## 2015-08-29 LAB — HEMOGLOBIN A1C
HEMOGLOBIN A1C: 6.3 % — AB (ref 4.8–5.6)
MEAN PLASMA GLUCOSE: 134 mg/dL

## 2015-08-29 LAB — BASIC METABOLIC PANEL
Anion gap: 6 (ref 5–15)
BUN: 7 mg/dL (ref 6–20)
CALCIUM: 7.7 mg/dL — AB (ref 8.9–10.3)
CO2: 24 mmol/L (ref 22–32)
CREATININE: 0.83 mg/dL (ref 0.44–1.00)
Chloride: 107 mmol/L (ref 101–111)
GFR calc non Af Amer: >60 mL/min (ref >60)
Glucose, Bld: 141 mg/dL — ABNORMAL HIGH (ref 65–99)
Potassium: 3.7 mmol/L (ref 3.5–5.1)
SODIUM: 137 mmol/L (ref 135–145)

## 2015-08-29 LAB — TSH: TSH: 1.91 u[IU]/mL (ref 0.350–4.500)

## 2015-08-29 LAB — URINE CULTURE: Culture: NO GROWTH

## 2015-08-29 LAB — FERRITIN: Ferritin: 87 ng/mL (ref 11–307)

## 2015-08-29 LAB — INFLUENZA PANEL BY PCR (TYPE A & B)
H1N1 flu by pcr: NOT DETECTED
Influenza A By PCR: NEGATIVE
Influenza B By PCR: NEGATIVE

## 2015-08-29 LAB — VANCOMYCIN, TROUGH

## 2015-08-29 LAB — RETICULOCYTES
RBC.: 4.34 MIL/uL (ref 3.87–5.11)
RETIC COUNT ABSOLUTE: 52.1 10*3/uL (ref 19.0–186.0)
Retic Ct Pct: 1.2 % (ref 0.4–3.1)

## 2015-08-29 LAB — VITAMIN B12: VITAMIN B 12: 118 pg/mL — AB (ref 180–914)

## 2015-08-29 LAB — T4, FREE: FREE T4: 1.51 ng/dL — AB (ref 0.61–1.12)

## 2015-08-29 LAB — GLUCOSE, CAPILLARY: GLUCOSE-CAPILLARY: 107 mg/dL — AB (ref 65–99)

## 2015-08-29 LAB — IRON AND TIBC
IRON: 15 ug/dL — AB (ref 28–170)
Saturation Ratios: 7 % — ABNORMAL LOW (ref 10.4–31.8)
TIBC: 230 ug/dL — ABNORMAL LOW (ref 250–450)
UIBC: 215 ug/dL

## 2015-08-29 LAB — FOLATE: FOLATE: 17.3 ng/mL (ref >5.9)

## 2015-08-29 MED ORDER — VANCOMYCIN HCL 500 MG IV SOLR
500.0000 mg | Freq: Once | INTRAVENOUS | Status: AC
  Administered 2015-08-29: 500 mg via INTRAVENOUS
  Filled 2015-08-29: qty 500

## 2015-08-29 MED ORDER — LEVOFLOXACIN IN D5W 250 MG/50ML IV SOLN
250.0000 mg | INTRAVENOUS | Status: DC
  Administered 2015-08-29 – 2015-08-30 (×2): 250 mg via INTRAVENOUS
  Filled 2015-08-29 (×2): qty 50

## 2015-08-29 NOTE — Progress Notes (Addendum)
[facility] Progress Note   Maria [name]  [id number]  DOB: [birth year]  DOA: 08/27/2015 PCP: Pcp Not In System  Brief narrative: Maria [name] is a 39 y.o. 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 She presents to the hospital for fevers. She is followed by no one for her chronic medical issues. She was recently referred to a urologist after she developed abdominal pain and bloating. A CT that was performed to workup her symptoms revealed right extrarenal pelvis with dilatation of the proximal right ureter and abrupt narrowing of the proximal/mid ureter. No mass was seen. When she was evaluated by the urologist yesterday. The history in regards to fevers was obtained and temperature at that time was noted to be 101 tympanic. A UA and urine culture were obtained and the urologist referred her to the ER for further workup. In the ER and x-ray revealed possible bibasilar infiltrates in bilateral lungs. Her oxygen saturation was 99-100% on room air  Subjective: Nonverbal.   Assessment/Plan: Principal Problem:   Sepsis/ hypoxic resp failure/ acute encephalopathy -Lactic acid normalized-tachycardia and hypoxia resolved overnight - chest x-ray- reveals bibasilar opacities -she is incontinent of urine- likely catheter placed on 12/9 to carefully monitor I & O -UA negative - Narrow antibiotics to Levaquin only-blood cultures negative to date - strep Pneumo antigen negative -Central line placed by PCCM as unable to draw blood even with a foot stick  Active Problems:  Hypokalemia - mild- replaced via IV    Developmental delay - non-verbal, incontinent and wheelchair bound- diet consists of pureed foods   Abnormal thyroid function studies -We'll repeat today  Code Status:     Code Status Orders        Start     Ordered   08/28/15 0157  Full code   Continuous      08/28/15 0157    Advance Directive Documentation        Most Recent Value   Type of Advance Directive  Healthcare Power of [place]   Pre-existing out of facility DNR order (yellow form or pink MOST form)     "MOST" Form in Place?       Family Communication:   Disposition Plan: follow in ICU/ SDU DVT prophylaxis: Heparin Consultants: Procedures: Central line   Antibiotics: Anti-infectives    Start     Dose/Rate Route Frequency Ordered Stop   08/29/15 1000  Levofloxacin (LEVAQUIN) IVPB 250 mg     250 mg 50 mL/hr over 60 Minutes Intravenous Every 24 hours 08/29/15 0849     08/29/15 0545  vancomycin (VANCOCIN) 500 mg in sodium chloride 0.9 % 100 mL IVPB     500 mg 100 mL/hr over 60 Minutes Intravenous  Once 08/29/15 0540 08/29/15 0652   08/28/15 2200  azithromycin (ZITHROMAX) 500 mg in dextrose 5 % 250 mL IVPB  Status:  Discontinued     500 mg 250 mL/hr over 60 Minutes Intravenous Daily at bedtime 08/28/15 0157 08/28/15 0923   08/28/15 2200  azithromycin (ZITHROMAX) 250 mg in dextrose 5 % 125 mL IVPB  Status:  Discontinued     250 mg 125 mL/hr over 60 Minutes Intravenous Daily at bedtime 08/28/15 0923 08/29/15 0738   08/28/15 1600  piperacillin-tazobactam (ZOSYN) IVPB 2.25 g  Status:  Discontinued     2.25 g 100 mL/hr over 30 Minutes Intravenous Every 8 hours 08/28/15 0919 08/29/15 0738   08/28/15  1000  cefTRIAXone (ROCEPHIN) 2 g in dextrose 5 % 50 mL IVPB  Status:  Discontinued     2 g 100 mL/hr over 30 Minutes Intravenous Daily 08/28/15 0157 08/28/15 0759   08/28/15 0900  vancomycin (VANCOCIN) 500 mg in sodium chloride 0.9 % 100 mL IVPB     500 mg 100 mL/hr over 60 Minutes Intravenous  Once 08/28/15 0819 08/28/15 0959   08/28/15 0800  piperacillin-tazobactam (ZOSYN) IVPB 3.375 g  Status:  Discontinued     3.375 g 12.5 mL/hr over 240 Minutes Intravenous Every 8 hours 08/28/15 0739 08/28/15 0919   08/27/15 2300  cefTRIAXone (ROCEPHIN) 1 g in dextrose 5 % 50 mL IVPB     1  g 100 mL/hr over 30 Minutes Intravenous  Once 08/27/15 2257 08/27/15 2353   08/27/15 2300  azithromycin (ZITHROMAX) 500 mg in dextrose 5 % 250 mL IVPB     500 mg 250 mL/hr over 60 Minutes Intravenous  Once 08/27/15 2257 08/28/15 0109      Objective: Filed Weights   08/27/15 2044 08/29/15 0400  Weight: 25.401 kg (56 lb) 29.7 kg (65 lb 7.6 oz)    Intake/Output Summary (Last 24 hours) at 08/29/15 0942 Last data filed at 08/29/15 0900  Gross per 24 hour  Intake 3386.25 ml  Output   3175 ml  Net 211.25 ml     Vitals Filed Vitals:   08/29/15 0600 08/29/15 0700 08/29/15 0800 08/29/15 0900  BP: 108/84 117/70 101/68 117/81  Pulse: 112 86 78 94  Temp: 98 F (36.7 C)  97.8 F (36.6 C)   TempSrc:   Axillary   Resp: 30 22 20 18   Height:      Weight:      SpO2: 100% 100% 100% 98%    Exam:  General:  Pt is alert, not in acute distress  HEENT: No icterus, No thrush, oral mucosa moist  Cardiovascular: regular rate and rhythm, S1/S2 No murmur  Respiratory: clear to auscultation bilaterally   Abdomen: Soft, +Bowel sounds, non tender, non distended, no guarding  MSK: No LE edema, cyanosis or clubbing  Data Reviewed: Basic Metabolic Panel:  Recent Labs Lab 08/27/15 2126 08/28/15 0255 08/29/15 0441  NA 140 140 137  K 4.2 3.4* 3.7  CL 103 104 107  CO2 25 26 24   GLUCOSE 228* 214* 141*  BUN 8 6 7   CREATININE 0.96 0.78 0.83  CALCIUM 10.0 9.2 7.7*   Liver Function Tests:  Recent Labs Lab 08/27/15 2126 08/28/15 0255  AST 45* 29  ALT 34 28  ALKPHOS 95 88  BILITOT 0.9 0.7  PROT 8.3* 7.6  ALBUMIN 4.4 3.8   No results for input(s): LIPASE, AMYLASE in the last 168 hours. No results for input(s): AMMONIA in the last 168 hours. CBC:  Recent Labs Lab 08/27/15 2126 08/28/15 0255 08/29/15 0441  WBC 13.5* 11.4* 10.6*  NEUTROABS 11.0*  --   --   HGB 12.8 11.6* 9.9*  HCT 40.2 37.9 31.7*  MCV 73.5* 74.2* 73.0*  PLT PLATELET CLUMPS NOTED ON SMEAR, COUNT APPEARS  ADEQUATE 183 182   Cardiac Enzymes: No results for input(s): CKTOTAL, CKMB, CKMBINDEX, TROPONINI in the last 168 hours. BNP (last 3 results) No results for input(s): BNP in the last 8760 hours.  ProBNP (last 3 results) No results for input(s): PROBNP in the last 8760 hours.  CBG:  Recent Labs Lab 08/28/15 0907 08/29/15 0820  GLUCAP 120* 107*    Recent Results (from the past 240  hour(s))  MRSA PCR Screening     Status: None   Collection Time: 08/28/15  7:40 AM  Result Value Ref Range Status   MRSA by PCR NEGATIVE NEGATIVE Final    Comment:        The GeneXpert MRSA Assay (FDA approved for NASAL specimens only), is one component of a comprehensive MRSA colonization surveillance program. It is not intended to diagnose MRSA infection nor to guide or monitor treatment for MRSA infections.   Urine culture     Status: None (Preliminary result)   Collection Time: 08/28/15  9:25 AM  Result Value Ref Range Status   Specimen Description URINE, CLEAN CATCH  Final   Special Requests NONE  Final   Culture   Final    NO GROWTH < 24 HOURS Performed at [facility]    Report Status PENDING  Incomplete     Studies: Dg Chest Port 1 View  08/28/2015  CLINICAL DATA:  Abdomen pain with fever and shortness of breath. EXAM: PORTABLE CHEST 1 VIEW COMPARISON:  08/27/2015. FINDINGS: Cardiomegaly.  Improved lung volumes. Slight opacity at at both bases could represent infiltrate or atelectasis similar to yesterday's radiograph. No acute osseous findings. IMPRESSION: Stable exam.  Indeterminate bibasilar densities. Electronically Signed   By: Staci Righter M.D.   On: 08/28/2015 08:37   Dg Chest Portable 1 View  08/27/2015  CLINICAL DATA:  Acute onset of generalized abdominal pain and fever. Initial encounter. EXAM: PORTABLE CHEST 1 VIEW COMPARISON:  None. FINDINGS: The lungs are mildly hypoexpanded. Mild vascular congestion is noted. Mild bibasilar opacities may reflect atelectasis or  possibly mild pneumonia. There is no evidence of pleural effusion or pneumothorax. The cardiomediastinal silhouette is within normal limits. No acute osseous abnormalities are seen. IMPRESSION: Lungs mildly hypoexpanded. Mild vascular congestion noted. Mild bibasilar opacities may reflect atelectasis or possibly mild pneumonia. Electronically Signed   By: Garald Balding M.D.   On: 08/27/2015 22:43    Scheduled Meds:  Scheduled Meds: . atropine  1 drop Right Eye QHS  . heparin  5,000 Units Subcutaneous 3 times per day  . latanoprost  1 drop Left Eye QHS  . levofloxacin (LEVAQUIN) IV  250 mg Intravenous Q24H  . sodium chloride  250 mL Intravenous Once   Continuous Infusions: . sodium chloride 75 mL/hr at 08/29/15 0242    Time spent on care of this patient: 50 min   [place], MD 08/29/2015, 9:42 AM  LOS: 2 days   [facility] Office  [phone] Pager - Text Page per [email or web address] If 7PM-7AM, please contact night-coverage [email or web address]

## 2015-08-29 NOTE — Progress Notes (Signed)
ANTIBIOTIC CONSULT NOTE - FOLLOW UP  Pharmacy Consult for Vancomycin Indication: sepsis  No Known Allergies  Patient Measurements: Height: 4' (121.9 cm) Weight: 65 lb 7.6 oz (29.7 kg) IBW/kg (Calculated) : 17.9 Adjusted Body Weight:   Vital Signs: Temp: 96.5 F (35.8 C) (12/10 0400) Temp Source: Axillary (12/10 0400) BP: 123/78 mmHg (12/10 0405) Pulse Rate: 107 (12/10 0405) Intake/Output from previous day: 12/09 0701 - 12/10 0700 In: 3811.3 [I.V.:1461.3; IV Piggyback:1350] Out: 2600 [Urine:2600] Intake/Output from this shift: Total I/O In: 675 [I.V.:675] Out: 1300 [Urine:1300]  Labs:  Recent Labs  08/27/15 2126 08/28/15 0255 08/29/15 0441  WBC 13.5* 11.4* 10.6*  HGB 12.8 11.6* 9.9*  PLT PLATELET CLUMPS NOTED ON SMEAR, COUNT APPEARS ADEQUATE 183 182  CREATININE 0.96 0.78 0.83   Estimated Creatinine Clearance: 32.1 mL/min (by C-G formula based on Cr of 0.83).  Recent Labs  08/29/15 0441  [place] <4*     Microbiology: Recent Results (from the past 720 hour(s))  MRSA PCR Screening     Status: None   Collection Time: 08/28/15  7:40 AM  Result Value Ref Range Status   MRSA by PCR NEGATIVE NEGATIVE Final    Comment:        The GeneXpert MRSA Assay (FDA approved for NASAL specimens only), is one component of a comprehensive MRSA colonization surveillance program. It is not intended to diagnose MRSA infection nor to guide or monitor treatment for MRSA infections.     Anti-infectives    Start     Dose/Rate Route Frequency Ordered Stop   08/29/15 0545  vancomycin (VANCOCIN) 500 mg in sodium chloride 0.9 % 100 mL IVPB     500 mg 100 mL/hr over 60 Minutes Intravenous  Once 08/29/15 0540     08/28/15 2200  azithromycin (ZITHROMAX) 500 mg in dextrose 5 % 250 mL IVPB  Status:  Discontinued     500 mg 250 mL/hr over 60 Minutes Intravenous Daily at bedtime 08/28/15 0157 08/28/15 0923   08/28/15 2200  azithromycin (ZITHROMAX) 250 mg in dextrose 5 % 125 mL  IVPB     250 mg 125 mL/hr over 60 Minutes Intravenous Daily at bedtime 08/28/15 0923     08/28/15 1600  piperacillin-tazobactam (ZOSYN) IVPB 2.25 g     2.25 g 100 mL/hr over 30 Minutes Intravenous Every 8 hours 08/28/15 0919     08/28/15 1000  cefTRIAXone (ROCEPHIN) 2 g in dextrose 5 % 50 mL IVPB  Status:  Discontinued     2 g 100 mL/hr over 30 Minutes Intravenous Daily 08/28/15 0157 08/28/15 0759   08/28/15 0900  vancomycin (VANCOCIN) 500 mg in sodium chloride 0.9 % 100 mL IVPB     500 mg 100 mL/hr over 60 Minutes Intravenous  Once 08/28/15 0819 08/28/15 0959   08/28/15 0800  piperacillin-tazobactam (ZOSYN) IVPB 3.375 g  Status:  Discontinued     3.375 g 12.5 mL/hr over 240 Minutes Intravenous Every 8 hours 08/28/15 0739 08/28/15 0919   08/27/15 2300  cefTRIAXone (ROCEPHIN) 1 g in dextrose 5 % 50 mL IVPB     1 g 100 mL/hr over 30 Minutes Intravenous  Once 08/27/15 2257 08/27/15 2353   08/27/15 2300  azithromycin (ZITHROMAX) 500 mg in dextrose 5 % 250 mL IVPB     500 mg 250 mL/hr over 60 Minutes Intravenous  Once 08/27/15 2257 08/28/15 0109      Assessment: Patient with low vancomycin level.  Dosing based on levels.  Goal of Therapy:  Vancomycin  trough level 15-20 mcg/ml  Plan:  Follow up culture results  Redose vancomycin 500mg  iv x1 Recheck level as needed.  Tyler Deis, Shea Stakes [place] 08/29/2015,5:47 AM

## 2015-08-29 NOTE — Progress Notes (Signed)
ANTIBIOTIC CONSULT NOTE - INITIAL  Pharmacy Consult for Levaquin Indication: rule out sepsis  No Known Allergies  Patient Measurements: Height: 4' (121.9 cm) Weight: 65 lb 7.6 oz (29.7 kg) IBW/kg (Calculated) : 17.9  Vital Signs: Temp: 98 F (36.7 C) (12/10 0600) Temp Source: Axillary (12/10 0400) BP: 108/84 mmHg (12/10 0600) Pulse Rate: 112 (12/10 0600) Intake/Output from previous day: 12/09 0701 - 12/10 0700 In: 4136.3 [I.V.:1686.3; IV Piggyback:1450] Out: 2825 [Urine:2825] Intake/Output from this shift:    Labs:  Recent Labs  08/27/15 2126 08/28/15 0255 08/29/15 0441  WBC 13.5* 11.4* 10.6*  HGB 12.8 11.6* 9.9*  PLT PLATELET CLUMPS NOTED ON SMEAR, COUNT APPEARS ADEQUATE 183 182  CREATININE 0.96 0.78 0.83   Estimated Creatinine Clearance: 32.1 mL/min (by C-G formula based on Cr of 0.83).  Recent Labs  08/29/15 0441  [place] <4*     Microbiology: Recent Results (from the past 720 hour(s))  MRSA PCR Screening     Status: None   Collection Time: 08/28/15  7:40 AM  Result Value Ref Range Status   MRSA by PCR NEGATIVE NEGATIVE Final    Comment:        The GeneXpert MRSA Assay (FDA approved for NASAL specimens only), is one component of a comprehensive MRSA colonization surveillance program. It is not intended to diagnose MRSA infection nor to guide or monitor treatment for MRSA infections.   Urine culture     Status: None (Preliminary result)   Collection Time: 08/28/15  9:25 AM  Result Value Ref Range Status   Specimen Description URINE, CLEAN CATCH  Final   Special Requests NONE  Final   Culture   Final    NO GROWTH < 24 HOURS Performed at [facility]    Report Status PENDING  Incomplete    Medical History: Past Medical History  Diagnosis Date  . Glaucoma   . Mental retardation    Medications:  Scheduled:  . atropine  1 drop Right Eye QHS  . heparin  5,000 Units Subcutaneous 3 times per day  . latanoprost  1 drop Left Eye  QHS  . sodium chloride  250 mL Intravenous Once   Anti-infectives    Start     Dose/Rate Route Frequency Ordered Stop   08/29/15 0545  vancomycin (VANCOCIN) 500 mg in sodium chloride 0.9 % 100 mL IVPB     500 mg 100 mL/hr over 60 Minutes Intravenous  Once 08/29/15 0540 08/29/15 0652   08/28/15 2200  azithromycin (ZITHROMAX) 500 mg in dextrose 5 % 250 mL IVPB  Status:  Discontinued     500 mg 250 mL/hr over 60 Minutes Intravenous Daily at bedtime 08/28/15 0157 08/28/15 0923   08/28/15 2200  azithromycin (ZITHROMAX) 250 mg in dextrose 5 % 125 mL IVPB  Status:  Discontinued     250 mg 125 mL/hr over 60 Minutes Intravenous Daily at bedtime 08/28/15 0923 08/29/15 0738   08/28/15 1600  piperacillin-tazobactam (ZOSYN) IVPB 2.25 g  Status:  Discontinued     2.25 g 100 mL/hr over 30 Minutes Intravenous Every 8 hours 08/28/15 0919 08/29/15 0738   08/28/15 1000  cefTRIAXone (ROCEPHIN) 2 g in dextrose 5 % 50 mL IVPB  Status:  Discontinued     2 g 100 mL/hr over 30 Minutes Intravenous Daily 08/28/15 0157 08/28/15 0759   08/28/15 0900  vancomycin (VANCOCIN) 500 mg in sodium chloride 0.9 % 100 mL IVPB     500 mg 100 mL/hr over 60 Minutes Intravenous  Once 08/28/15 0819 08/28/15 0959   08/28/15 0800  piperacillin-tazobactam (ZOSYN) IVPB 3.375 g  Status:  Discontinued     3.375 g 12.5 mL/hr over 240 Minutes Intravenous Every 8 hours 08/28/15 0739 08/28/15 0919   08/27/15 2300  cefTRIAXone (ROCEPHIN) 1 g in dextrose 5 % 50 mL IVPB     1 g 100 mL/hr over 30 Minutes Intravenous  Once 08/27/15 2257 08/27/15 2353   08/27/15 2300  azithromycin (ZITHROMAX) 500 mg in dextrose 5 % 250 mL IVPB     500 mg 250 mL/hr over 60 Minutes Intravenous  Once 08/27/15 2257 08/28/15 0109     Assessment: Patient with mental retardation and (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  Pt with fever in ED.  MD notes prior urology workup but per family unresolved, fevers for months.  CXR in  ED shows possible PNA.  Elevated Lactic acid, transfer to ICU, d/c Rocephin and begin Vancomycin and Zosyn, pharmacy to dose.  Unable to obtain labs via peripheral stick, placing Femoral line  Discussed abx dosing with Pediatric Pharmacist at [facility]. Continuing Azithromycin along with Vanc and Zosyn, U/A negative, rule out PNA, rule out sepsis.  12/8 Rocephin >> 12/9 12/8 Azithromycin >> 12/10 12/9 Vanc >> 12/10 12/9 Zosyn >> 12/10 12/10 Levaquin >>  Microbiology Results: 12/9 BCx: collected after abx begun 12/9 UCx: collected after abx begun 12/ MRSA PCR: sent  Strep pneumo negative/Legionella pending: ordered after abx begun  Goal of Therapy:  Vancomycin trough level 15-20 mcg/ml  Plan:   Change antibiotics to Levaquin only, dose 250mg  daily  Minda Ditto PharmD Pager [phone] 08/29/2015, 8:45 AM

## 2015-08-30 LAB — OCCULT BLOOD X 1 CARD TO LAB, STOOL: Fecal Occult Bld: NEGATIVE

## 2015-08-30 LAB — GLUCOSE, CAPILLARY: GLUCOSE-CAPILLARY: 114 mg/dL — AB (ref 65–99)

## 2015-08-30 MED ORDER — VITAMIN B-12 1000 MCG PO TABS: 1000.0000 ug | ORAL_TABLET | Freq: Every day | ORAL | Status: DC

## 2015-08-30 MED ORDER — LEVOFLOXACIN 500 MG PO TABS: 500.0000 mg | ORAL_TABLET | Freq: Every day | ORAL | Status: DC

## 2015-08-30 MED ORDER — CYANOCOBALAMIN 1000 MCG/ML IJ SOLN
1000.0000 ug | Freq: Once | INTRAMUSCULAR | Status: AC
  Administered 2015-08-30: 1000 ug via SUBCUTANEOUS
  Filled 2015-08-30: qty 1

## 2015-08-30 NOTE — Discharge Summary (Signed)
Physician Discharge Summary  Maria [name] [id number] DOB: [birth year] DOA: 08/27/2015  PCP: Pcp Not In System  Admit date: 08/27/2015 Discharge date: 08/30/2015  Time spent: 60 minutes  Recommendations for Outpatient Follow-up:  1. Recheck TFTs in 2 wks  Discharge Condition: stable  Discharge Diagnoses:  Principal Problem:   Sepsis ([place]) Active Problems:   Mental retardation   CAP (community acquired pneumonia)   Respiratory failure, acute ([place])   Pyrexia   Difficult intravenous access   History of present illness:  Maria [name] is a 39 y.o. 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 She presents to the hospital for fevers. She is followed by no one for her chronic medical issues. She was recently referred to a urologist after she developed abdominal pain and bloating. A CT that was performed to workup her symptoms on 11/16 revealed right extrarenal pelvis with dilatation of the proximal right ureter and abrupt narrowing of the proximal/mid ureter. No mass was seen. When she was evaluated by the urologist yesterday. The history in regards to fevers was obtained and temperature at that time was noted to be 101 tympanic. A UA and urine culture were obtained and the urologist referred her to the ER for further workup. In the ER and x-ray revealed possible bibasilar infiltrates in bilateral lungs. Her oxygen saturation was 99-100% on room air  Hospital Course:  Principal Problem:  Sepsis/ hypoxic resp failure/ acute encephalopathy - Lactic acid normalized-tachycardia and hypoxia resolved overnight - chest x-ray- reveals bibasilar opacities - she is incontinent of urine- likely catheter placed on 12/9 to carefully monitor I & O  - strep Pneumo antigen negative- flu swab negative - Central line placed by PCCM as unable to draw blood even with a foot stick -  urine and blood  cultures negative to date- I have reviewed the UA from 11/29 as well which was negative for a UTI  - 12/10 Narrowed antibiotics to Levaquin to treat for CAP as no other source of sepsis found  Active Problems:  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   Anemia- microcytic  - found to have B12 deficiency have given 1000 mcg s/c today- will start oral replacement - Iron level, TIBC and saturation low, Ferritin normal- already on Iron as outpt - Folate 17.3   Hypokalemia - mild- replaced via IV  Developmental delay - non-verbal, incontinent and wheelchair bound- diet consists of pureed foods   Abnormal thyroid function studies - TSH normal, F T4 low- recheck in 2 wks   Left adrenal adenoma - undergoing outpt work up  Procedures:  Left femoral line  Consultations:  PCCM  Discharge Exam: Filed Weights   08/27/15 2044 08/29/15 0400  Weight: 25.401 kg (56 lb) 29.7 kg (65 lb 7.6 oz)   Filed Vitals:   08/30/15 0547 08/30/15 1323  BP: 150/75 146/80  Pulse: 113 98  Temp: 98.5 F (36.9 C) 98.2 F (36.8 C)  Resp: 16 20    General: AAO x 3, no distress Cardiovascular: RRR, no murmurs  Respiratory: clear to auscultation bilaterally GI: soft, non-tender, non-distended, bowel sound positive  Discharge Instructions You were cared for by a hospitalist during your  hospital stay. If you have any questions about your discharge medications or the care you received while you were in the hospital after you are discharged, you can call the unit and asked to speak with the hospitalist on call if the hospitalist that took care of you is not available. Once you are discharged, your primary care physician will handle any further medical issues. Please note that NO REFILLS for  any discharge medications will be authorized once you are discharged, as it is imperative that you return to your primary care physician (or establish a relationship with a primary care physician if you do not have one) for your aftercare needs so that they can reassess your need for medications and monitor your lab values.      Discharge Instructions    Diet - low sodium heart healthy    Complete by:  As directed      Increase activity slowly    Complete by:  As directed             Medication List    TAKE these medications        atropine 1 % ophthalmic solution  Place 1 drop into the right eye at bedtime.     ferrous sulfate 325 (65 FE) MG tablet  Take 650 mg by mouth daily with breakfast.     ibuprofen 100 MG/5ML suspension  Commonly known as:  ADVIL,MOTRIN  Take by mouth every 4 (four) hours as needed for mild pain. 43mL     latanoprost 0.005 % ophthalmic solution  Commonly known as:  XALATAN  Place 1 drop into the left eye at bedtime.     levofloxacin 500 MG tablet  Commonly known as:  LEVAQUIN  Take 1 tablet (500 mg total) by mouth daily.     simvastatin 40 MG tablet  Commonly known as:  ZOCOR  Take 40 mg by mouth every evening.     vitamin B-12 1000 MCG tablet  Commonly known as:  CYANOCOBALAMIN  Take 1 tablet (1,000 mcg total) by mouth daily.       No Known Allergies    The results of significant diagnostics from this hospitalization (including imaging, microbiology, ancillary and laboratory) are listed below for reference.    Significant Diagnostic Studies: Ct Abdomen Wo Contrast  August 10, 2015  CLINICAL DATA:  Elevated cortisol, leukocytosis, hair loss, growth spurt. EXAM: CT ABDOMEN WITHOUT CONTRAST TECHNIQUE: Multidetector CT imaging of the abdomen was performed following the standard protocol without IV contrast. COMPARISON:  None. FINDINGS: Intravenous contrast could not be administered due to inability to obtain IV access. Lower chest:  Lung bases  are clear. Hepatobiliary: Unenhanced liver is unremarkable. Gallbladder is notable for layering sludge and/or noncalcified gallstones (series 2/ image 26). No associated inflammatory changes. No intrahepatic or extrahepatic ductal dilatation. Pancreas: Within normal limits. Spleen: Within normal limits. Adrenals/Urinary Tract: Right adrenal gland is within normal limits. 10 mm left adrenal nodule (series 2/ image 35), measuring less than 10 HUs on unenhanced CT (coronal image 34), compatible with a left adrenal adenoma. Kidneys are unremarkable. Right extrarenal pelvis with dilatation of the proximal right ureter. Abrupt narrowing of the proximal/mid ureter (series 2/image 64). No mass is visualized on unenhanced CT. Stomach/Bowel: Stomach and visualized bowel are unremarkable. Vascular/Lymphatic: No evidence abdominal aortic aneurysm. No suspicious abdominal lymphadenopathy. Other: No abdominal ascites. Musculoskeletal: Thoracolumbar dextroscoliosis. IMPRESSION: Intravenous contrast could not be administered due to inability to obtain IV access. 10 mm benign left adrenal adenoma. Layering gallbladder sludge  and/or noncalcified gallstones. No associated inflammatory changes. Right extrarenal pelvis with dilatation of the proximal right ureter. Abrupt narrowing of the proximal/mid ureter, possibly reflecting a ureteral stricture. No masses is visualized on unenhanced CT. Primary differential considerations include postinfectious/inflammatory stricture or stricture related to prior stone disease (favored), although not visualized urothelial neoplasm is not entirely excluded. Correlate for hematuria and consider cystoscopy as clinically warranted. Please note that the pelvis was not imaged. Electronically Signed   By: Julian Hy M.D.   On: 08/05/2015 10:32   Dg Chest Port 1 View  08/28/2015  CLINICAL DATA:  Abdomen pain with fever and shortness of breath. EXAM: PORTABLE CHEST 1 VIEW COMPARISON:  08/27/2015.  FINDINGS: Cardiomegaly.  Improved lung volumes. Slight opacity at at both bases could represent infiltrate or atelectasis similar to yesterday's radiograph. No acute osseous findings. IMPRESSION: Stable exam.  Indeterminate bibasilar densities. Electronically Signed   By: Staci Righter M.D.   On: 08/28/2015 08:37   Dg Chest Portable 1 View  08/27/2015  CLINICAL DATA:  Acute onset of generalized abdominal pain and fever. Initial encounter. EXAM: PORTABLE CHEST 1 VIEW COMPARISON:  None. FINDINGS: The lungs are mildly hypoexpanded. Mild vascular congestion is noted. Mild bibasilar opacities may reflect atelectasis or possibly mild pneumonia. There is no evidence of pleural effusion or pneumothorax. The cardiomediastinal silhouette is within normal limits. No acute osseous abnormalities are seen. IMPRESSION: Lungs mildly hypoexpanded. Mild vascular congestion noted. Mild bibasilar opacities may reflect atelectasis or possibly mild pneumonia. Electronically Signed   By: Garald Balding M.D.   On: 08/27/2015 22:43    Microbiology: Recent Results (from the past 240 hour(s))  Culture, blood (routine x 2)     Status: None (Preliminary result)   Collection Time: 08/28/15 12:41 AM  Result Value Ref Range Status   Specimen Description BLOOD RIGHT WRIST  Final   Special Requests IN PEDIATRIC BOTTLE 3CC  Final   Culture   Final    NO GROWTH 2 DAYS Performed at [facility]    Report Status PENDING  Incomplete  Culture, blood (routine x 2) Call MD if unable to obtain prior to antibiotics being given     Status: None (Preliminary result)   Collection Time: 08/28/15  2:55 AM  Result Value Ref Range Status   Specimen Description BLOOD LEFT ARM  Final   Special Requests IN PEDIATRIC BOTTLE 2CC  Final   Culture   Final    NO GROWTH 2 DAYS Performed at [facility]    Report Status PENDING  Incomplete  MRSA PCR Screening     Status: None   Collection Time: 08/28/15  7:40 AM  Result Value Ref  Range Status   MRSA by PCR NEGATIVE NEGATIVE Final    Comment:        The GeneXpert MRSA Assay (FDA approved for NASAL specimens only), is one component of a comprehensive MRSA colonization surveillance program. It is not intended to diagnose MRSA infection nor to guide or monitor treatment for MRSA infections.   Urine culture     Status: None   Collection Time: 08/28/15  9:25 AM  Result Value Ref Range Status   Specimen Description URINE, CLEAN CATCH  Final   Special Requests NONE  Final   Culture   Final    NO GROWTH 1 DAY Performed at [facility]    Report Status 08/29/2015 FINAL  Final     Labs: Basic Metabolic Panel:  Recent Labs Lab 08/27/15 2126  08/28/15 0255 08/29/15 0441  NA 140 140 137  K 4.2 3.4* 3.7  CL 103 104 107  CO2 25 26 24   GLUCOSE 228* 214* 141*  BUN 8 6 7   CREATININE 0.96 0.78 0.83  CALCIUM 10.0 9.2 7.7*   Liver Function Tests:  Recent Labs Lab 08/27/15 2126 08/28/15 0255  AST 45* 29  ALT 34 28  ALKPHOS 95 88  BILITOT 0.9 0.7  PROT 8.3* 7.6  ALBUMIN 4.4 3.8   No results for input(s): LIPASE, AMYLASE in the last 168 hours. No results for input(s): AMMONIA in the last 168 hours. CBC:  Recent Labs Lab 08/27/15 2126 08/28/15 0255 08/29/15 0441  WBC 13.5* 11.4* 10.6*  NEUTROABS 11.0*  --   --   HGB 12.8 11.6* 9.9*  HCT 40.2 37.9 31.7*  MCV 73.5* 74.2* 73.0*  PLT PLATELET CLUMPS NOTED ON SMEAR, COUNT APPEARS ADEQUATE 183 182   Cardiac Enzymes: No results for input(s): CKTOTAL, CKMB, CKMBINDEX, TROPONINI in the last 168 hours. BNP: BNP (last 3 results) No results for input(s): BNP in the last 8760 hours.  ProBNP (last 3 results) No results for input(s): PROBNP in the last 8760 hours.  CBG:  Recent Labs Lab 08/28/15 0907 08/29/15 0820 08/30/15 0819  GLUCAP 120* 107* 114*       SignedDebbe Odea, MD [facility] 08/30/2015, 4:29 PM

## 2015-08-30 NOTE — Progress Notes (Signed)
Patient discharged home, all discharge medications and instructions reviewed with patient's mother. Patient's foley cathter removed per MD order.  Patient assisted to vehicle by wheelchair.

## 2015-08-31 LAB — LEGIONELLA ANTIGEN, URINE

## 2015-09-02 LAB — CULTURE, BLOOD (ROUTINE X 2)
CULTURE: NO GROWTH
Culture: NO GROWTH

## 2017-10-07 ENCOUNTER — Inpatient Hospital Stay (HOSPITAL_COMMUNITY)
Admission: EM | Admit: 2017-10-07 | Discharge: 2017-10-09 | DRG: 194 | Disposition: A | Discharge status: Home / Self Care | Payer: Medicare Other | Attending: Internal Medicine | Admitting: Internal Medicine

## 2017-10-07 ENCOUNTER — Other Ambulatory Visit: Payer: Self-pay

## 2017-10-07 ENCOUNTER — Encounter (HOSPITAL_COMMUNITY): Payer: Self-pay

## 2017-10-07 ENCOUNTER — Emergency Department (HOSPITAL_COMMUNITY): Payer: Medicare Other

## 2017-10-07 DIAGNOSIS — G809 Cerebral palsy, unspecified: Secondary | ICD-10-CM | POA: Diagnosis present

## 2017-10-07 DIAGNOSIS — R059 Cough, unspecified: Secondary | ICD-10-CM

## 2017-10-07 DIAGNOSIS — Z789 Other specified health status: Secondary | ICD-10-CM | POA: Diagnosis present

## 2017-10-07 DIAGNOSIS — E872 Acidosis, unspecified: Secondary | ICD-10-CM | POA: Diagnosis present

## 2017-10-07 DIAGNOSIS — J181 Lobar pneumonia, unspecified organism: Secondary | ICD-10-CM | POA: Diagnosis not present

## 2017-10-07 DIAGNOSIS — E860 Dehydration: Secondary | ICD-10-CM | POA: Diagnosis present

## 2017-10-07 DIAGNOSIS — R21 Rash and other nonspecific skin eruption: Secondary | ICD-10-CM | POA: Diagnosis present

## 2017-10-07 DIAGNOSIS — L539 Erythematous condition, unspecified: Secondary | ICD-10-CM | POA: Diagnosis present

## 2017-10-07 DIAGNOSIS — R05 Cough: Secondary | ICD-10-CM

## 2017-10-07 DIAGNOSIS — J189 Pneumonia, unspecified organism: Secondary | ICD-10-CM | POA: Diagnosis present

## 2017-10-07 DIAGNOSIS — F79 Unspecified intellectual disabilities: Secondary | ICD-10-CM | POA: Diagnosis present

## 2017-10-07 DIAGNOSIS — N390 Urinary tract infection, site not specified: Secondary | ICD-10-CM | POA: Diagnosis present

## 2017-10-07 DIAGNOSIS — H409 Unspecified glaucoma: Secondary | ICD-10-CM | POA: Diagnosis present

## 2017-10-07 DIAGNOSIS — R509 Fever, unspecified: Secondary | ICD-10-CM | POA: Diagnosis present

## 2017-10-07 DIAGNOSIS — R8271 Bacteriuria: Secondary | ICD-10-CM | POA: Diagnosis present

## 2017-10-07 LAB — CBC WITH DIFFERENTIAL/PLATELET
BASOS ABS: 0.1 10*3/uL (ref 0.0–0.1)
BASOS PCT: 1 %
EOS PCT: 5 %
Eosinophils Absolute: 0.5 10*3/uL (ref 0.0–0.7)
HCT: 43.4 % (ref 36.0–46.0)
Hemoglobin: 13.7 g/dL (ref 12.0–15.0)
LYMPHS PCT: 14 %
Lymphs Abs: 1.5 10*3/uL (ref 0.7–4.0)
MCH: 23.7 pg — ABNORMAL LOW (ref 26.0–34.0)
MCHC: 31.6 g/dL (ref 30.0–36.0)
MCV: 75.2 fL — AB (ref 78.0–100.0)
MONO ABS: 1 10*3/uL (ref 0.1–1.0)
Monocytes Relative: 9 %
Neutro Abs: 7.4 10*3/uL (ref 1.7–7.7)
Neutrophils Relative %: 71 %
Platelets: 222 10*3/uL (ref 150–400)
RBC: 5.77 MIL/uL — AB (ref 3.87–5.11)
RDW: 16.8 % — AB (ref 11.5–15.5)
WBC: 10.4 10*3/uL (ref 4.0–10.5)

## 2017-10-07 LAB — COMPREHENSIVE METABOLIC PANEL
ALT: 14 U/L (ref 14–54)
ANION GAP: 10 (ref 5–15)
AST: 27 U/L (ref 15–41)
Albumin: 4.2 g/dL (ref 3.5–5.0)
Alkaline Phosphatase: 101 U/L (ref 38–126)
BUN: 7 mg/dL (ref 6–20)
CHLORIDE: 107 mmol/L (ref 101–111)
CO2: 24 mmol/L (ref 22–32)
Calcium: 9.7 mg/dL (ref 8.9–10.3)
Creatinine, Ser: 0.8 mg/dL (ref 0.44–1.00)
Glucose, Bld: 119 mg/dL — ABNORMAL HIGH (ref 65–99)
Potassium: 4.6 mmol/L (ref 3.5–5.1)
SODIUM: 141 mmol/L (ref 135–145)
Total Bilirubin: 1.5 mg/dL — ABNORMAL HIGH (ref 0.3–1.2)
Total Protein: 8.4 g/dL — ABNORMAL HIGH (ref 6.5–8.1)

## 2017-10-07 LAB — I-STAT CG4 LACTIC ACID, ED: LACTIC ACID, VENOUS: 4.6 mmol/L — AB (ref 0.5–1.9)

## 2017-10-07 LAB — URINALYSIS, ROUTINE W REFLEX MICROSCOPIC
Bacteria, UA: NONE SEEN
Bilirubin Urine: NEGATIVE
Glucose, UA: >=500 mg/dL — AB
HGB URINE DIPSTICK: NEGATIVE
Ketones, ur: NEGATIVE mg/dL
Leukocytes, UA: NEGATIVE
Nitrite: NEGATIVE
Protein, ur: NEGATIVE mg/dL
SPECIFIC GRAVITY, URINE: 1 — AB (ref 1.005–1.030)
SQUAMOUS EPITHELIAL / LPF: NONE SEEN
pH: 7 (ref 5.0–8.0)

## 2017-10-07 LAB — INFLUENZA PANEL BY PCR (TYPE A & B)
INFLAPCR: NEGATIVE
INFLBPCR: NEGATIVE

## 2017-10-07 LAB — PROTIME-INR
INR: 0.91
Prothrombin Time: 12.2 seconds (ref 11.4–15.2)

## 2017-10-07 LAB — I-STAT BETA HCG BLOOD, ED (MC, WL, AP ONLY)

## 2017-10-07 MED ORDER — CEFTRIAXONE SODIUM 1 G IJ SOLR: 1.0000 g | INTRAMUSCULAR | Status: DC

## 2017-10-07 MED ORDER — DIPHENHYDRAMINE HCL 50 MG/ML IJ SOLN
12.5000 mg | Freq: Four times a day (QID) | INTRAMUSCULAR | Status: AC
  Administered 2017-10-07 – 2017-10-08 (×5): 12.5 mg via INTRAVENOUS
  Filled 2017-10-07 (×5): qty 1

## 2017-10-07 MED ORDER — ONDANSETRON HCL 4 MG PO TABS: 4.0000 mg | ORAL_TABLET | Freq: Four times a day (QID) | ORAL | Status: DC | PRN

## 2017-10-07 MED ORDER — HYDROXYZINE HCL 25 MG PO TABS: 25.0000 mg | ORAL_TABLET | Freq: Four times a day (QID) | ORAL | Status: DC | PRN

## 2017-10-07 MED ORDER — SODIUM CHLORIDE 0.9 % IV SOLN: 250.0000 mL | INTRAVENOUS | Status: DC | PRN

## 2017-10-07 MED ORDER — DEXTROSE 5 % IV SOLN
500.0000 mg | INTRAVENOUS | Status: DC
  Administered 2017-10-08: 500 mg via INTRAVENOUS
  Filled 2017-10-07 (×2): qty 500

## 2017-10-07 MED ORDER — CEFTRIAXONE SODIUM 1 G IJ SOLR
1.0000 g | Freq: Once | INTRAMUSCULAR | Status: AC
  Administered 2017-10-07: 1 g via INTRAVENOUS
  Filled 2017-10-07: qty 10

## 2017-10-07 MED ORDER — SODIUM CHLORIDE 0.9 % IV BOLUS (SEPSIS)
1000.0000 mL | Freq: Once | INTRAVENOUS | Status: AC
  Administered 2017-10-07: 1000 mL via INTRAVENOUS

## 2017-10-07 MED ORDER — POLYETHYLENE GLYCOL 3350 17 G PO PACK: 17.0000 g | PACK | Freq: Every day | ORAL | Status: DC | PRN

## 2017-10-07 MED ORDER — ACETAMINOPHEN 650 MG RE SUPP: 650.0000 mg | Freq: Four times a day (QID) | RECTAL | Status: DC | PRN

## 2017-10-07 MED ORDER — FAMOTIDINE 40 MG/5ML PO SUSR: 20.0000 mg | Freq: Two times a day (BID) | ORAL | Status: DC

## 2017-10-07 MED ORDER — RANITIDINE HCL 150 MG/10ML PO SYRP
150.0000 mg | ORAL_SOLUTION | Freq: Two times a day (BID) | ORAL | Status: DC
Start: 2017-10-07 — End: 2017-10-09
  Administered 2017-10-07 – 2017-10-09 (×4): 150 mg via ORAL
  Filled 2017-10-07 (×5): qty 10

## 2017-10-07 MED ORDER — FERROUS SULFATE 325 (65 FE) MG PO TABS
650.0000 mg | ORAL_TABLET | Freq: Every day | ORAL | Status: DC
  Administered 2017-10-08 – 2017-10-09 (×2): 650 mg via ORAL
  Filled 2017-10-07 (×2): qty 2

## 2017-10-07 MED ORDER — DEXTROSE 5 % IV SOLN
500.0000 mg | Freq: Once | INTRAVENOUS | Status: AC
  Administered 2017-10-07: 500 mg via INTRAVENOUS
  Filled 2017-10-07: qty 500

## 2017-10-07 MED ORDER — ADULT MULTIVITAMIN W/MINERALS CH
1.0000 | ORAL_TABLET | Freq: Every day | ORAL | Status: DC
  Administered 2017-10-08 – 2017-10-09 (×2): 1 via ORAL
  Filled 2017-10-07 (×2): qty 1

## 2017-10-07 MED ORDER — IPRATROPIUM-ALBUTEROL 0.5-2.5 (3) MG/3ML IN SOLN
3.0000 mL | Freq: Two times a day (BID) | RESPIRATORY_TRACT | Status: DC
  Administered 2017-10-08: 3 mL via RESPIRATORY_TRACT
  Filled 2017-10-07: qty 3

## 2017-10-07 MED ORDER — ACETAMINOPHEN 325 MG PO TABS
650.0000 mg | ORAL_TABLET | Freq: Four times a day (QID) | ORAL | Status: DC | PRN
  Administered 2017-10-08: 650 mg via ORAL
  Filled 2017-10-07: qty 2

## 2017-10-07 MED ORDER — ONDANSETRON HCL 4 MG/2ML IJ SOLN: 4.0000 mg | Freq: Four times a day (QID) | INTRAMUSCULAR | Status: DC | PRN

## 2017-10-07 MED ORDER — SODIUM CHLORIDE 0.9 % IV SOLN
INTRAVENOUS | Status: DC
  Administered 2017-10-07 – 2017-10-09 (×3): via INTRAVENOUS

## 2017-10-07 MED ORDER — SODIUM CHLORIDE 0.9% FLUSH
3.0000 mL | Freq: Two times a day (BID) | INTRAVENOUS | Status: DC
  Administered 2017-10-07 – 2017-10-09 (×2): 3 mL via INTRAVENOUS

## 2017-10-07 MED ORDER — MIDAZOLAM HCL 5 MG/ML IJ SOLN
2.0000 mg | Freq: Once | INTRAMUSCULAR | Status: AC
  Administered 2017-10-07: 2 mg via NASAL
  Filled 2017-10-07: qty 1

## 2017-10-07 MED ORDER — TRAZODONE HCL 50 MG PO TABS: 50.0000 mg | ORAL_TABLET | Freq: Every evening | ORAL | Status: DC | PRN

## 2017-10-07 MED ORDER — SENNA 8.6 MG PO TABS
1.0000 | ORAL_TABLET | Freq: Two times a day (BID) | ORAL | Status: DC
  Administered 2017-10-07 – 2017-10-09 (×4): 8.6 mg via ORAL
  Filled 2017-10-07 (×4): qty 1

## 2017-10-07 MED ORDER — SODIUM CHLORIDE 0.9% FLUSH: 3.0000 mL | INTRAVENOUS | Status: DC | PRN

## 2017-10-07 MED ORDER — CEFTRIAXONE SODIUM 2 G IJ SOLR
2.0000 g | INTRAMUSCULAR | Status: DC
  Administered 2017-10-08 – 2017-10-09 (×2): 2 g via INTRAVENOUS
  Filled 2017-10-07 (×2): qty 2

## 2017-10-07 MED ORDER — DIPHENHYDRAMINE HCL 12.5 MG/5ML PO ELIX: 25.0000 mg | ORAL_SOLUTION | Freq: Once | ORAL | Status: DC

## 2017-10-07 MED ORDER — ALBUTEROL SULFATE (2.5 MG/3ML) 0.083% IN NEBU: 2.5000 mg | INHALATION_SOLUTION | RESPIRATORY_TRACT | Status: DC | PRN

## 2017-10-07 MED ORDER — GUAIFENESIN ER 600 MG PO TB12
600.0000 mg | ORAL_TABLET | Freq: Two times a day (BID) | ORAL | Status: DC
  Administered 2017-10-07 – 2017-10-08 (×3): 600 mg via ORAL
  Filled 2017-10-07 (×3): qty 1

## 2017-10-07 MED ORDER — HEPARIN SODIUM (PORCINE) 5000 UNIT/ML IJ SOLN
5000.0000 [IU] | Freq: Three times a day (TID) | INTRAMUSCULAR | Status: DC
  Administered 2017-10-07 – 2017-10-08 (×3): 5000 [IU] via SUBCUTANEOUS
  Filled 2017-10-07 (×3): qty 1

## 2017-10-07 MED ORDER — IPRATROPIUM-ALBUTEROL 0.5-2.5 (3) MG/3ML IN SOLN
3.0000 mL | Freq: Four times a day (QID) | RESPIRATORY_TRACT | Status: DC
  Administered 2017-10-07 (×2): 3 mL via RESPIRATORY_TRACT
  Filled 2017-10-07 (×2): qty 3

## 2017-10-07 MED ORDER — MIDAZOLAM HCL 2 MG/2ML IJ SOLN: 2.0000 mg | Freq: Once | INTRAMUSCULAR | Status: DC

## 2017-10-07 MED ORDER — LATANOPROST 0.005 % OP SOLN
1.0000 [drp] | Freq: Every day | OPHTHALMIC | Status: DC
  Administered 2017-10-07 – 2017-10-08 (×2): 1 [drp] via OPHTHALMIC
  Filled 2017-10-07: qty 2.5

## 2017-10-07 MED ORDER — SIMVASTATIN 20 MG PO TABS
40.0000 mg | ORAL_TABLET | Freq: Every evening | ORAL | Status: DC
  Administered 2017-10-07 – 2017-10-08 (×2): 40 mg via ORAL
  Filled 2017-10-07 (×2): qty 2
  Filled 2017-10-07 (×2): qty 4
  Filled 2017-10-07: qty 2

## 2017-10-07 MED ORDER — HYDROCODONE-ACETAMINOPHEN 5-325 MG PO TABS: 1.0000 | ORAL_TABLET | ORAL | Status: DC | PRN

## 2017-10-07 NOTE — H&P (Signed)
Patient Demographics:    Maria [name], is a 42 y.o. female  MRN: [id number]   DOB - March 30, 1976  Admit Date - 10/07/2017  Outpatient Primary MD for the patient is System, Pcp Not In   Assessment & Plan:    Principal Problem:   CAP (community acquired pneumonia) Active Problems:   Sepsis ([place])   Mental retardation   Pyrexia   Difficult intravenous access   Pneumonia    1)??? Recurrent LT sided CAP-presenting with recurrent fevers, she was diagnosed with left-sided pneumonia on 08/16/17 at  [facility] clinic and treated with azithromycin, apparently fever resolved, patient improved, clinical exam and imaging study again suggested left-sided pneumonia today 19th January 2019, we will treat empirically with IV Rocephin/azithromycin along with bronchodilators and mucolytics pending culture data.  No leukocytosis at this time, influenza is negative  2)Possible UTI-she was seen at [facility] clinic on 09/28/2017 and started on Macrobid for presumed UTI without a urine test at that time , based on foul-smelling urine, patient's UA today does not suggest UTI at this time ,   3)Rash-erythematous macular papular rash on extremities, ???  Viral exanthem versus drug reaction, give Benadryl and Pepcid, consider infectious disease consult in am   4)NeuroPsych-patient has cerebral palsy and unidentified severe developmental delayed-at baseline patient uses a wheelchair she can crawl, at baseline patient is nonverbal, she may sounds and cries in order to communicate  With History of - Reviewed by me  Past Medical History:  Diagnosis Date  . Glaucoma   . Mental retardation   . Renal disorder       Past Surgical History:  Procedure Laterality Date  . CLEFT PALATE REPAIR    . EYE SURGERY        Chief Complaint  Patient  presents with  . Fever      HPI:    Maria [name]  is a 42 y.o. female with past medical history relevant for  cerebral palsy and unidentified severe developmental delayed who presents to the ED due to concerns about recurrent fevers. Patient is nonverbal at baseline, history obtained from patient's mother and sister at bedside, apparently patient has had recurrent fevers, she was diagnosed with left-sided pneumonia on 08/16/17 at  [facility] clinic and treated with azithromycin, apparently fever resolved, patient improved overall, now the fevers are back to  Today in the ED again the concerns about fevers and elevated lactic acid, however no leukocytosis at this time, influenza is negative  she was seen at [facility] clinic on 09/28/2017 and started on Macrobid for presumed UTI without a urine test at that time based on foul-smelling urine, patient's UA today does not suggest UTI at this time ,   Patient started itching today, he is noted to have an erythematous macular papular rash on extremities, ???  Viral exanthem versus drug reaction,      Review of systems:    In addition to the HPI above,  A full 12 point Review of 10 Systems was done, except as stated above, all other Review of 10 Systems were negative.    Social History:  Reviewed by me    Social History   Tobacco Use  . Smoking status: Never Smoker  . Smokeless tobacco: Never Used  Substance Use Topics  . Alcohol use: No    Frequency: Never       Family History :  Reviewed by me   History reviewed. No pertinent family history.  Addisons in mother   Home Medications:   Prior to Admission medications   Medication Sig Start Date End Date Taking? Authorizing Provider  ferrous sulfate 325 (65 FE) MG tablet Take 650 mg by mouth daily with breakfast.   Yes [provider]  latanoprost (XALATAN) 0.005 % ophthalmic solution Place 1 drop into the left eye at bedtime. 02/18/15  Yes [provider]    Multiple Vitamin (MULTIVITAMIN WITH MINERALS) TABS tablet Take 1 tablet by mouth daily.   Yes [provider]  nitrofurantoin, macrocrystal-monohydrate, (MACROBID) 100 MG capsule Take 100 mg by mouth 2 (two) times daily. 09/28/17  Yes [provider]  simvastatin (ZOCOR) 40 MG tablet Take 40 mg by mouth every evening.   Yes [provider]     Allergies:    No Known Allergies   Physical Exam:   Vitals  Blood pressure (!) 130/108, pulse (!) 123, temperature 98.1 F (36.7 C), temperature source Oral, resp. rate (!) 21, last menstrual period 09/20/2017, SpO2 91 %.  Physical Examination: General appearance -combative with exam and procedures Mental status -severe cognitive deficits, nonverbal Eyes - sclera anicteric Neck - supple, no JVD elevation , Chest -diminished breath sounds in bases, scattered rhonchi more so on the left Heart - S1 and S2 normal,  Abdomen - soft, nontender, nondistended, no masses or organomegaly Neurological -severe neuromuscular deficits consistent with underlying developmental delay/cerebral palsy Extremities/Skin - no pedal edema noted, intact peripheral pulses, erythematous maculopapular rash on upper and lower extremities, no streaking, no drainage    Data Review:    CBC Recent Labs  Lab 10/07/17 1302  WBC 10.4  HGB 13.7  HCT 43.4  PLT 222  MCV 75.2*  MCH 23.7*  MCHC 31.6  RDW 16.8*  LYMPHSABS 1.5  MONOABS 1.0  EOSABS 0.5  BASOSABS 0.1   ------------------------------------------------------------------------------------------------------------------  Chemistries  Recent Labs  Lab 10/07/17 1302  NA 141  K 4.6  CL 107  CO2 24  GLUCOSE 119*  BUN 7  CREATININE 0.80  CALCIUM 9.7  AST 27  ALT 14  ALKPHOS 101  BILITOT 1.5*   ------------------------------------------------------------------------------------------------------------------ CrCl cannot be calculated (Unknown ideal  weight.). ------------------------------------------------------------------------------------------------------------------ No results for input(s): TSH, T4TOTAL, T3FREE, THYROIDAB in the last 72 hours.  Invalid input(s): FREET3   Coagulation profile Recent Labs  Lab 10/07/17 1302  INR 0.91   ------------------------------------------------------------------------------------------------------------------- No results for input(s): DDIMER in the last 72 hours. -------------------------------------------------------------------------------------------------------------------  Cardiac Enzymes No results for input(s): CKMB, TROPONINI, MYOGLOBIN in the last 168 hours.  Invalid input(s): CK ------------------------------------------------------------------------------------------------------------------ No results found for: BNP   ---------------------------------------------------------------------------------------------------------------  Urinalysis    Component Value Date/Time   COLORURINE COLORLESS (A) 10/07/2017 1700   APPEARANCEUR CLEAR 10/07/2017 1700   LABSPEC 1.000 (L) 10/07/2017 1700   PHURINE 7.0 10/07/2017 1700   GLUCOSEU >=500 (A) 10/07/2017 1700   HGBUR NEGATIVE 10/07/2017 1700   BILIRUBINUR NEGATIVE 10/07/2017 1700   KETONESUR NEGATIVE 10/07/2017 1700   PROTEINUR NEGATIVE 10/07/2017 1700  NITRITE NEGATIVE 10/07/2017 1700   LEUKOCYTESUR NEGATIVE 10/07/2017 1700    ----------------------------------------------------------------------------------------------------------------   Imaging Results:    Dg Chest 1 View  Result Date: 10/07/2017 CLINICAL DATA:  Cough for 2 weeks.  Fever. EXAM: CHEST 1 VIEW COMPARISON:  August 28, 2015 FINDINGS: There is patchy infiltrate in the left base. Lungs elsewhere are clear. Heart size and pulmonary vascularity are normal. No adenopathy. No appreciable bone lesions. IMPRESSION: Patchy infiltrate left base felt to represent  pneumonia. Lungs elsewhere clear. Heart size normal. Electronically Signed   By: Lowella Grip III M.D.   On: 10/07/2017 13:44    Radiological Exams on Admission: Dg Chest 1 View  Result Date: 10/07/2017 CLINICAL DATA:  Cough for 2 weeks.  Fever. EXAM: CHEST 1 VIEW COMPARISON:  August 28, 2015 FINDINGS: There is patchy infiltrate in the left base. Lungs elsewhere are clear. Heart size and pulmonary vascularity are normal. No adenopathy. No appreciable bone lesions. IMPRESSION: Patchy infiltrate left base felt to represent pneumonia. Lungs elsewhere clear. Heart size normal. Electronically Signed   By: Lowella Grip III M.D.   On: 10/07/2017 13:44    DVT Prophylaxis -SCD  AM Labs Ordered, also please review Full Orders  Family Communication: Admission, patients condition and plan of care including tests being ordered have been discussed with the mother and sister who indicate understanding and agree with the plan   Code Status - Full Code  Likely DC to  home  Condition   stable  Roxan Hockey M.D on 10/07/2017 at 6:43 PM   Between 7am to 7pm - Pager - [phone] After 7pm go to [email or web address] - password [email or web address]  [facility] - Office  [phone]  Voice Recognition Viviann Spare dictation system was used to create this note, attempts have been made to correct errors. Please contact the author with questions and/or clarifications.

## 2017-10-07 NOTE — ED Notes (Signed)
Medications and IV fluids delayed d/t no IV access. Sedating to gain access once pharmacy verifies med

## 2017-10-07 NOTE — ED Provider Notes (Signed)
[facility] DEPT Provider Note   CSN: [id number] Arrival date & time: 10/07/17  1213     History   Chief Complaint Chief Complaint  Patient presents with  . Fever    HPI Maria [name] is a 42 y.o. female.  The history is provided by a parent. No language interpreter was used.  Fever     Maria [name] is a 42 y.o. female who presents to the Emergency Department complaining of fever, crying.  L5 caveat due to developmental delay.  History is provided by the patient's mother.  Her mother reports that 3 weeks ago she developed fevers to 102 and 103 at home and was diagnosed with a pneumonia on chest x-ray.  She was treated at that time with an unknown antibiotic.  Her fevers did resolve.  About a week ago she developed foul-smelling urine and saw her PCP and was started on Macrobid for UTI.  4 days ago she developed fevers to 102 and 103 and last night they note that she has been having increased cough as well as crying more and seeming uncomfortable.  She has a history of developmental delay of unknown etiology and is at baseline nonverbal and cries to communicate.  She does have a history of urinary tract infection in the past.  Symptoms are severe and constant in nature. Past Medical History:  Diagnosis Date  . Glaucoma   . Mental retardation   . Renal disorder     Patient Active Problem List   Diagnosis Date Noted  . Pneumonia 10/07/2017  . Respiratory failure, acute ([place]) 08/28/2015  . Pyrexia   . Difficult intravenous access   . Sepsis ([place]) 08/27/2015  . Mental retardation 08/27/2015  . CAP (community acquired pneumonia) 08/27/2015    Past Surgical History:  Procedure Laterality Date  . CLEFT PALATE REPAIR    . EYE SURGERY      OB History    No data available       Home Medications    Prior to Admission medications   Medication Sig Start Date End Date Taking? Authorizing Provider  ferrous sulfate 325 (65 FE) MG tablet Take  650 mg by mouth daily with breakfast.   Yes [provider]  latanoprost (XALATAN) 0.005 % ophthalmic solution Place 1 drop into the left eye at bedtime. 02/18/15  Yes [provider]  Multiple Vitamin (MULTIVITAMIN WITH MINERALS) TABS tablet Take 1 tablet by mouth daily.   Yes [provider]  nitrofurantoin, macrocrystal-monohydrate, (MACROBID) 100 MG capsule Take 100 mg by mouth 2 (two) times daily. 09/28/17  Yes [provider]  simvastatin (ZOCOR) 40 MG tablet Take 40 mg by mouth every evening.   Yes [provider]    Family History History reviewed. No pertinent family history.  Social History Social History   Tobacco Use  . Smoking status: Never Smoker  . Smokeless tobacco: Never Used  Substance Use Topics  . Alcohol use: No    Frequency: Never  . Drug use: No     Allergies   Patient has no known allergies.   Review of Systems Review of Systems  Unable to perform ROS: Patient nonverbal  Constitutional: Positive for fever.     Physical Exam Updated Vital Signs BP 104/74 (BP Location: Right Arm)   Pulse (!) 113   Temp 98.1 F (36.7 C) (Oral)   Resp (!) 22   LMP 09/20/2017   SpO2 100%   Physical Exam  Constitutional:  Developmental  delay.  Small stature.  HENT:  Microcephaly  Cardiovascular: Regular rhythm.  No murmur heard. Tachycardic  Pulmonary/Chest: Effort normal and breath sounds normal. No respiratory distress.  Abdominal: Soft. There is no tenderness. There is no rebound and no guarding.  Musculoskeletal: She exhibits no edema or tenderness.  Neurological: She is alert.  Nonverbal.  Cries at times on exam.  Moves all extremities.  Does not follow commands.  Skin: Skin is warm and dry. Capillary refill takes less than 2 seconds.  Scattered erythematous rash to bilateral lower extremities and bilateral upper extremities.  There are patches of erythematous papules.  No petechiae.  Psychiatric:  Unable to  assess  Nursing note and vitals reviewed.    ED Treatments / Results  Labs (all labs ordered are listed, but only abnormal results are displayed) Labs Reviewed  COMPREHENSIVE METABOLIC PANEL - Abnormal; Notable for the following components:      Result Value   Glucose, Bld 119 (*)    Total Protein 8.4 (*)    Total Bilirubin 1.5 (*)    All other components within normal limits  CBC WITH DIFFERENTIAL/PLATELET - Abnormal; Notable for the following components:   RBC 5.77 (*)    MCV 75.2 (*)    MCH 23.7 (*)    RDW 16.8 (*)    All other components within normal limits  I-STAT CG4 LACTIC ACID, ED - Abnormal; Notable for the following components:   Lactic Acid, Venous 4.60 (*)    All other components within normal limits  CULTURE, BLOOD (ROUTINE X 2)  CULTURE, BLOOD (ROUTINE X 2)  URINE CULTURE  RESPIRATORY PANEL BY PCR  PROTIME-INR  URINALYSIS, ROUTINE W REFLEX MICROSCOPIC  INFLUENZA PANEL BY PCR (TYPE A & B)  I-STAT BETA HCG BLOOD, ED ([facility], [facility], [facility] ONLY)  I-STAT CG4 LACTIC ACID, ED    EKG  EKG Interpretation None       Radiology Dg Chest 1 View  Result Date: 10/07/2017 CLINICAL DATA:  Cough for 2 weeks.  Fever. EXAM: CHEST 1 VIEW COMPARISON:  August 28, 2015 FINDINGS: There is patchy infiltrate in the left base. Lungs elsewhere are clear. Heart size and pulmonary vascularity are normal. No adenopathy. No appreciable bone lesions. IMPRESSION: Patchy infiltrate left base felt to represent pneumonia. Lungs elsewhere clear. Heart size normal. Electronically Signed   By: Lowella Grip III M.D.   On: 10/07/2017 13:44    Procedures Procedures (including critical care time) CRITICAL CARE Performed by: Quintella Reichert   Total critical care time: 35 minutes  Critical care time was exclusive of separately billable procedures and treating other patients.  Critical care was necessary to treat or prevent imminent or life-threatening deterioration.  Critical care was time  spent personally by me on the following activities: development of treatment plan with patient and/or surrogate as well as nursing, discussions with consultants, evaluation of patient's response to treatment, examination of patient, obtaining history from patient or surrogate, ordering and performing treatments and interventions, ordering and review of laboratory studies, ordering and review of radiographic studies, pulse oximetry and re-evaluation of patient's condition.  Medications Ordered in ED Medications  azithromycin (ZITHROMAX) 500 mg in dextrose 5 % 250 mL IVPB (500 mg Intravenous New Bag/Given 10/07/17 1601)  azithromycin (ZITHROMAX) 500 mg in dextrose 5 % 250 mL IVPB (not administered)  guaiFENesin (MUCINEX) 12 hr tablet 600 mg (not administered)  ipratropium-albuterol (DUONEB) 0.5-2.5 (3) MG/3ML nebulizer solution 3 mL (not administered)  cefTRIAXone (ROCEPHIN) 2 g in dextrose 5 %  50 mL IVPB (not administered)  sodium chloride 0.9 % bolus 1,000 mL (0 mLs Intravenous Stopped 10/07/17 1632)  cefTRIAXone (ROCEPHIN) 1 g in dextrose 5 % 50 mL IVPB (0 g Intravenous Stopped 10/07/17 1624)  midazolam (VERSED) injection 2 mg (2 mg Nasal Given 10/07/17 1514)  sodium chloride 0.9 % bolus 1,000 mL (1,000 mLs Intravenous New Bag/Given 10/07/17 1637)     Initial Impression / Assessment and Plan / ED Course  I have reviewed the triage vital signs and the nursing notes.  Pertinent labs & imaging results that were available during my care of the patient were reviewed by me and considered in my medical decision making (see chart for details).     Patient with developmental delay here for evaluation of fever, cough.  She is mildly dehydrated appearing on examination with chest x-ray concerning for pneumonia.  Given her cough and fever will treat with antibiotics.  Initial lactate is markedly elevated at greater than 4.  She does appear well perfused on examination.  Given her developmental delay there was  delay in obtaining IV access due to body habitus as well as patient cooperation.  She was given intranasal Versed to facilitate IV access.  During her ED stay she did have progression of the papular rash located on bilateral lower extremities.  On repeat assessment it did appear to extend to involve her low back as well as bilateral hands.  There is no urticarial component nor hemorrhagic or vesicular component to her rash.  Rash did develop prior to administration of antibiotics and is not felt to be secondary to emergency department antibiotics.  Given pneumonia with elevated lactate, concern for sepsis hospitalist consulted for admission for further treatment.  Patient's mother was updated of findings and studies recommendation for admission for further treatment and she is in agreement with plan.  Final Clinical Impressions(s) / ED Diagnoses   Final diagnoses:  Community acquired pneumonia of left lower lobe of lung [facility])    ED Discharge Orders    None       Quintella Reichert, MD 10/07/17 1643

## 2017-10-07 NOTE — ED Notes (Signed)
ED TO INPATIENT HANDOFF REPORT  Name/Age/Gender Maria [name] 42 y.o. female  Code Status    Code Status Orders  (From admission, onward)        Start     Ordered   10/07/17 1821  Full code  Continuous     10/07/17 1820    Code Status History    Date Active Date Inactive Code Status Order ID Comments User Context   08/28/2015 01:57 08/30/2015 22:30 Full Code [id number]  Allyne Gee, MD Inpatient    Advance Directive Documentation     Most Recent Value  Type of Advance Directive  Healthcare Power of Attorney  Pre-existing out of facility DNR order (yellow form or pink MOST form)  No data  "MOST" Form in Place?  No data      Home/SNF/Other Home  Chief Complaint fever  Level of Care/Admitting Diagnosis ED Disposition    ED Disposition Condition Comment   Admit  Hospital Area: [place] [[id number]]  Level of Care: Med-Surg [16]  Diagnosis: Pneumonia [[id number]]  Admitting Physician: Morrison Old  Attending Physician: Morrison Old  Estimated length of stay: 3 - 4 days  Certification:: I certify this patient will need inpatient services for at least 2 midnights  Bed request comments: Tele  PT Class (Do Not Modify): Inpatient [101]  PT Acc Code (Do Not Modify): Private [1]       Medical History Past Medical History:  Diagnosis Date  . Glaucoma   . Mental retardation   . Renal disorder     Allergies No Known Allergies  IV Location/Drains/Wounds Patient Lines/Drains/Airways Status   Active Line/Drains/Airways    Name:   Placement date:   Placement time:   Site:   Days:   Peripheral IV 10/07/17 Left Antecubital   10/07/17    1547    Antecubital   less than 1          Labs/Imaging Results for orders placed or performed during the hospital encounter of 10/07/17 (from the past 48 hour(s))  Comprehensive metabolic panel     Status: Abnormal   Collection Time: 10/07/17  1:02 PM  Result Value Ref Range   Sodium 141 135 - 145 mmol/L   Potassium 4.6 3.5 - 5.1 mmol/L   Chloride 107 101 - 111 mmol/L   CO2 24 22 - 32 mmol/L   Glucose, Bld 119 (H) 65 - 99 mg/dL   BUN 7 6 - 20 mg/dL   Creatinine, Ser 0.80 0.44 - 1.00 mg/dL   Calcium 9.7 8.9 - 10.3 mg/dL   Total Protein 8.4 (H) 6.5 - 8.1 g/dL   Albumin 4.2 3.5 - 5.0 g/dL   AST 27 15 - 41 U/L   ALT 14 14 - 54 U/L   Alkaline Phosphatase 101 38 - 126 U/L   Total Bilirubin 1.5 (H) 0.3 - 1.2 mg/dL   GFR calc non Af Amer >60 >60 mL/min   GFR calc Af Amer >60 >60 mL/min    Comment: (NOTE) The eGFR has been calculated using the CKD EPI equation. This calculation has not been validated in all clinical situations. eGFR's persistently <60 mL/min signify possible Chronic Kidney Disease.    Anion gap 10 5 - 15  CBC with Differential     Status: Abnormal   Collection Time: 10/07/17  1:02 PM  Result Value Ref Range   WBC 10.4 4.0 - 10.5 K/uL   RBC 5.77 (H) 3.87 - 5.11 MIL/uL   Hemoglobin  13.7 12.0 - 15.0 g/dL   HCT 43.4 36.0 - 46.0 %   MCV 75.2 (L) 78.0 - 100.0 fL   MCH 23.7 (L) 26.0 - 34.0 pg   MCHC 31.6 30.0 - 36.0 g/dL   RDW 16.8 (H) 11.5 - 15.5 %   Platelets 222 150 - 400 K/uL   Neutrophils Relative % 71 %   Neutro Abs 7.4 1.7 - 7.7 K/uL   Lymphocytes Relative 14 %   Lymphs Abs 1.5 0.7 - 4.0 K/uL   Monocytes Relative 9 %   Monocytes Absolute 1.0 0.1 - 1.0 K/uL   Eosinophils Relative 5 %   Eosinophils Absolute 0.5 0.0 - 0.7 K/uL   Basophils Relative 1 %   Basophils Absolute 0.1 0.0 - 0.1 K/uL  Protime-INR     Status: None   Collection Time: 10/07/17  1:02 PM  Result Value Ref Range   Prothrombin Time 12.2 11.4 - 15.2 seconds   INR 0.91   I-Stat beta hCG blood, ED     Status: None   Collection Time: 10/07/17  1:15 PM  Result Value Ref Range   I-stat hCG, quantitative <5.0 <5 mIU/mL   Comment 3            Comment:   GEST. AGE      CONC.  (mIU/mL)   <=1 WEEK        5 - 50     2 WEEKS       50 - 500     3 WEEKS       100 - 10,000      4 WEEKS     1,000 - 30,000        FEMALE AND NON-PREGNANT FEMALE:     LESS THAN 5 mIU/mL   I-Stat CG4 Lactic Acid, ED     Status: Abnormal   Collection Time: 10/07/17  1:18 PM  Result Value Ref Range   Lactic Acid, Venous 4.60 (HH) 0.5 - 1.9 mmol/L   Comment NOTIFIED PHYSICIAN   Influenza panel by PCR (type A & B)     Status: None   Collection Time: 10/07/17  3:57 PM  Result Value Ref Range   Influenza A By PCR NEGATIVE NEGATIVE   Influenza B By PCR NEGATIVE NEGATIVE    Comment: (NOTE) The Xpert Xpress Flu assay is intended as an aid in the diagnosis of  influenza and should not be used as a sole basis for treatment.  This  assay is FDA approved for nasopharyngeal swab specimens only. Nasal  washings and aspirates are unacceptable for Xpert Xpress Flu testing.   Urinalysis, Routine w reflex microscopic     Status: Abnormal   Collection Time: 10/07/17  5:00 PM  Result Value Ref Range   Color, Urine COLORLESS (A) YELLOW   APPearance CLEAR CLEAR   Specific Gravity, Urine 1.000 (L) 1.005 - 1.030   pH 7.0 5.0 - 8.0   Glucose, UA >=500 (A) NEGATIVE mg/dL   Hgb urine dipstick NEGATIVE NEGATIVE   Bilirubin Urine NEGATIVE NEGATIVE   Ketones, ur NEGATIVE NEGATIVE mg/dL   Protein, ur NEGATIVE NEGATIVE mg/dL   Nitrite NEGATIVE NEGATIVE   Leukocytes, UA NEGATIVE NEGATIVE   RBC / HPF 0-5 0 - 5 RBC/hpf   WBC, UA 0-5 0 - 5 WBC/hpf   Bacteria, UA NONE SEEN NONE SEEN   Squamous Epithelial / LPF NONE SEEN NONE SEEN   Dg Chest 1 View  Result Date: 10/07/2017 CLINICAL DATA:  Cough  for 2 weeks.  Fever. EXAM: CHEST 1 VIEW COMPARISON:  August 28, 2015 FINDINGS: There is patchy infiltrate in the left base. Lungs elsewhere are clear. Heart size and pulmonary vascularity are normal. No adenopathy. No appreciable bone lesions. IMPRESSION: Patchy infiltrate left base felt to represent pneumonia. Lungs elsewhere clear. Heart size normal. Electronically Signed   By: Lowella Grip III M.D.   On:  10/07/2017 13:44    Pending Labs Unresulted Labs (From admission, onward)   Start     Ordered   10/08/17 1443  Basic metabolic panel  Tomorrow morning,   R     10/07/17 1820   10/08/17 0500  CBC  Tomorrow morning,   R     10/07/17 1820   10/07/17 1821  HIV antibody (Routine Testing)  Once,   R     10/07/17 1820   10/07/17 1626  Respiratory Panel by PCR  (Respiratory virus panel)  Once,   R     10/07/17 1625   10/07/17 1404  Urine culture  STAT,   STAT     10/07/17 1406   10/07/17 1258  Culture, blood (Routine x 2)  BLOOD CULTURE X 2,   STAT     10/07/17 1257      Vitals/Pain Today's Vitals   10/07/17 1544 10/07/17 1645 10/07/17 1700 10/07/17 1802  BP: 104/74  (!) 139/105 (!) 130/108  Pulse: (!) 113   (!) 123  Resp: (!) 22  19 (!) 21  Temp:      TempSrc:      SpO2: 100% (P) 100%  91%    Isolation Precautions Droplet precaution  Medications Medications  simvastatin (ZOCOR) tablet 40 mg (not administered)  ferrous sulfate tablet 650 mg (not administered)  latanoprost (XALATAN) 0.005 % ophthalmic solution 1 drop (not administered)  multivitamin with minerals tablet 1 tablet (not administered)  sodium chloride flush (NS) 0.9 % injection 3 mL (not administered)  sodium chloride flush (NS) 0.9 % injection 3 mL (not administered)  0.9 %  sodium chloride infusion (not administered)  acetaminophen (TYLENOL) tablet 650 mg (not administered)    Or  acetaminophen (TYLENOL) suppository 650 mg (not administered)  traZODone (DESYREL) tablet 50 mg (not administered)  senna (SENOKOT) tablet 8.6 mg (not administered)  polyethylene glycol (MIRALAX / GLYCOLAX) packet 17 g (not administered)  ondansetron (ZOFRAN) tablet 4 mg (not administered)    Or  ondansetron (ZOFRAN) injection 4 mg (not administered)  albuterol (PROVENTIL) (2.5 MG/3ML) 0.083% nebulizer solution 2.5 mg (not administered)  heparin injection 5,000 Units (not administered)  0.9 %  sodium chloride infusion (not  administered)  HYDROcodone-acetaminophen (NORCO/VICODIN) 5-325 MG per tablet 1-2 tablet (not administered)  azithromycin (ZITHROMAX) 500 mg in dextrose 5 % 250 mL IVPB (not administered)  guaiFENesin (MUCINEX) 12 hr tablet 600 mg (not administered)  ipratropium-albuterol (DUONEB) 0.5-2.5 (3) MG/3ML nebulizer solution 3 mL (3 mLs Nebulization Given 10/07/17 1645)  cefTRIAXone (ROCEPHIN) 2 g in dextrose 5 % 50 mL IVPB (not administered)  sodium chloride 0.9 % bolus 1,000 mL (0 mLs Intravenous Stopped 10/07/17 1632)  cefTRIAXone (ROCEPHIN) 1 g in dextrose 5 % 50 mL IVPB (0 g Intravenous Stopped 10/07/17 1624)  azithromycin (ZITHROMAX) 500 mg in dextrose 5 % 250 mL IVPB (500 mg Intravenous New Bag/Given 10/07/17 1601)  midazolam (VERSED) injection 2 mg (2 mg Nasal Given 10/07/17 1514)  sodium chloride 0.9 % bolus 1,000 mL (1,000 mLs Intravenous New Bag/Given 10/07/17 1637)    Mobility power wheelchair

## 2017-10-07 NOTE — ED Triage Notes (Signed)
Pt's caregiver reports low grade fever, cough, and crying x 2 weeks.  Pt was recently diagnosed w/ PNA and an UTI.  Pt has completed all antibiotics.

## 2017-10-08 DIAGNOSIS — N390 Urinary tract infection, site not specified: Secondary | ICD-10-CM

## 2017-10-08 DIAGNOSIS — F79 Unspecified intellectual disabilities: Secondary | ICD-10-CM

## 2017-10-08 DIAGNOSIS — R21 Rash and other nonspecific skin eruption: Secondary | ICD-10-CM

## 2017-10-08 LAB — RESPIRATORY PANEL BY PCR
ADENOVIRUS-RVPPCR: NOT DETECTED
Bordetella pertussis: NOT DETECTED
CORONAVIRUS 229E-RVPPCR: NOT DETECTED
CORONAVIRUS NL63-RVPPCR: NOT DETECTED
CORONAVIRUS OC43-RVPPCR: NOT DETECTED
Chlamydophila pneumoniae: NOT DETECTED
Coronavirus HKU1: NOT DETECTED
Influenza A: NOT DETECTED
Influenza B: NOT DETECTED
METAPNEUMOVIRUS-RVPPCR: NOT DETECTED
Mycoplasma pneumoniae: NOT DETECTED
PARAINFLUENZA VIRUS 1-RVPPCR: NOT DETECTED
PARAINFLUENZA VIRUS 2-RVPPCR: NOT DETECTED
PARAINFLUENZA VIRUS 3-RVPPCR: NOT DETECTED
Parainfluenza Virus 4: NOT DETECTED
RHINOVIRUS / ENTEROVIRUS - RVPPCR: NOT DETECTED
Respiratory Syncytial Virus: NOT DETECTED

## 2017-10-08 LAB — CBC
HEMATOCRIT: 35.6 % — AB (ref 36.0–46.0)
HEMOGLOBIN: 11.1 g/dL — AB (ref 12.0–15.0)
MCH: 23.8 pg — ABNORMAL LOW (ref 26.0–34.0)
MCHC: 31.2 g/dL (ref 30.0–36.0)
MCV: 76.4 fL — AB (ref 78.0–100.0)
Platelets: 126 10*3/uL — ABNORMAL LOW (ref 150–400)
RBC: 4.66 MIL/uL (ref 3.87–5.11)
RDW: 17.1 % — AB (ref 11.5–15.5)
WBC: 9.3 10*3/uL (ref 4.0–10.5)

## 2017-10-08 LAB — BASIC METABOLIC PANEL
ANION GAP: 7 (ref 5–15)
CHLORIDE: 114 mmol/L — AB (ref 101–111)
CO2: 23 mmol/L (ref 22–32)
Calcium: 8.3 mg/dL — ABNORMAL LOW (ref 8.9–10.3)
Creatinine, Ser: 0.78 mg/dL (ref 0.44–1.00)
GFR calc Af Amer: >60 mL/min (ref >60)
GFR calc non Af Amer: >60 mL/min (ref >60)
GLUCOSE: 113 mg/dL — AB (ref 65–99)
POTASSIUM: 3.7 mmol/L (ref 3.5–5.1)
Sodium: 144 mmol/L (ref 135–145)

## 2017-10-08 LAB — URINE CULTURE: CULTURE: NO GROWTH

## 2017-10-08 LAB — HIV ANTIBODY (ROUTINE TESTING W REFLEX): HIV Screen 4th Generation wRfx: NONREACTIVE

## 2017-10-08 MED ORDER — IPRATROPIUM-ALBUTEROL 0.5-2.5 (3) MG/3ML IN SOLN: 3.0000 mL | RESPIRATORY_TRACT | Status: DC | PRN

## 2017-10-08 MED ORDER — ENOXAPARIN SODIUM 30 MG/0.3ML ~~LOC~~ SOLN
30.0000 mg | SUBCUTANEOUS | Status: DC
  Administered 2017-10-08: 30 mg via SUBCUTANEOUS
  Filled 2017-10-08: qty 0.3

## 2017-10-08 NOTE — Progress Notes (Signed)
[facility] PROGRESS NOTE  Fredi Hurtado ZSW:[id number] DOB: 09/15/1976 DOA: 10/07/2017 PCP: System, Pcp Not In  Interim summary and history of present illness: 42 y.o. female with past medical history relevant for  cerebral palsy and unidentified severe developmental delayed who presents to the ED due to concerns about recurrent fevers. Patient is nonverbal at baseline, history obtained from patient's mother and sister at bedside, apparently patient has had recurrent fevers, she was diagnosed with left-sided pneumonia on 08/16/17 at  [facility] clinic and treated with azithromycin, apparently fever resolved, patient improved overall, now the fevers are back and the patient was having intermittent productive coughing spells.   Today in the ED again the concerns about fevers and elevated lactic acid, however no leukocytosis at this time, influenza is negative.   Assessment/Plan: 1-left lung community-acquired pneumonia -Continue IV antibiotics -Patient without any nausea vomiting and currently no requiring oxygen supplementation -Currently afebrile (but with low-grade temperature overnight) -The patient remains without fever for over 24 hours will discharge home with the use of Augmentin to complete a days total of antibiotics. -Negative respiratory panel and influenza by PCR  2-presumed UTI -She will be treated as an outpatient -No dysuria (even difficult to communicate with patient due to mental retardation) -Current antibiotics for problem #1 will also cover any infections in the urine. -Follow cultures  3-erythematous rash: Appreciated on admission -Completely resolved with the use of antihistamines -Unclear etiology at this point.  4-cerebral palsy with mental retardation: -Continue supportive care -Stable and essentially close to baseline according to family members.  5-lactic acidosis -Most likely associated with dehydration in the setting of acute infection -Continue  IV fluids  Code Status: Full code Family Communication: Sister at bedside Disposition Plan: Continue IV antibiotics, follow response.  Most likely stable to go home in a.m. with the use of Augmentin and complete 8 days of antibiotics.   Consultants:  None  Procedures:  See below for x-ray reports  Antibiotics:  Rocephin and Zithromax 10/08/17  HPI/Subjective: Afebrile currently, no chest pain, no requiring oxygen supplementation and in no acute distress.  No rash or petechiae appreciated on physical exam.  Objective: Vitals:   10/08/17 0620 10/08/17 1415  BP: (!) 120/57 (!) 111/92  Pulse: (!) 105 (!) 102  Resp: (!) 22 20  Temp: 97.6 F (36.4 C) 98.3 F (36.8 C)  SpO2: 99% 100%    Intake/Output Summary (Last 24 hours) at 10/08/2017 1443 Last data filed at 10/08/2017 1000 Gross per 24 hour  Intake 1877.5 ml  Output -  Net 1877.5 ml   Filed Weights   10/07/17 1854  Weight: 29.9 kg (65 lb 14.7 oz)    Exam:   General: Low-grade temperature overnight; currently afebrile, no chest pain, no requiring oxygen supplementation.  According to family at bedside patient is slightly somnolent and less interactive as she normally is.  Cardiovascular: S1 and S2, no rubs, no gallops, no JVD.  Respiratory: No wheezing, no crackles, normal respiratory effort, no using accessory muscles.  Positive for scattered rhonchi appreciated (left more than right).  Abdomen: Soft, nontender, nondistended, positive bowel sounds.  Musculoskeletal: No edema, no cyanosis, no clubbing.  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  Data Reviewed: Basic Metabolic Panel: Recent Labs  Lab 10/07/17 1302 10/08/17 0603  NA 141 144  K 4.6 3.7  CL 107 114*  CO2 24 23  GLUCOSE 119* 113*  BUN 7 <5*  CREATININE 0.80 0.78  CALCIUM 9.7 8.3*  Liver Function Tests: Recent Labs  Lab 10/07/17 1302  AST 27  ALT 14  ALKPHOS 101  BILITOT 1.5*   PROT 8.4*  ALBUMIN 4.2   CBC: Recent Labs  Lab 10/07/17 1302 10/08/17 0603  WBC 10.4 9.3  NEUTROABS 7.4  --   HGB 13.7 11.1*  HCT 43.4 35.6*  MCV 75.2* 76.4*  PLT 222 126*    Recent Results (from the past 240 hour(s))  Culture, blood (Routine x 2)     Status: None (Preliminary result)   Collection Time: 10/07/17  1:05 PM  Result Value Ref Range Status   Specimen Description BLOOD BLOOD LEFT ARM  Final   Special Requests IN PEDIATRIC BOTTLE Blood Culture adequate volume  Final   Culture   Final    NO GROWTH < 24 HOURS Performed at [facility], [place] [address] [place], [place] [address]    Report Status PENDING  Incomplete  Culture, blood (Routine x 2)     Status: None (Preliminary result)   Collection Time: 10/07/17  1:17 PM  Result Value Ref Range Status   Specimen Description BLOOD LEFT ARM  Final   Special Requests IN PEDIATRIC BOTTLE Blood Culture adequate volume  Final   Culture   Final    NO GROWTH < 24 HOURS Performed at [facility], [place] [address], [place], [place] [address]    Report Status PENDING  Incomplete  Respiratory Panel by PCR     Status: None   Collection Time: 10/07/17  4:26 PM  Result Value Ref Range Status   Adenovirus NOT DETECTED NOT DETECTED Final   Coronavirus 229E NOT DETECTED NOT DETECTED Final   Coronavirus HKU1 NOT DETECTED NOT DETECTED Final   Coronavirus NL63 NOT DETECTED NOT DETECTED Final   Coronavirus OC43 NOT DETECTED NOT DETECTED Final   Metapneumovirus NOT DETECTED NOT DETECTED Final   Rhinovirus / Enterovirus NOT DETECTED NOT DETECTED Final   Influenza A NOT DETECTED NOT DETECTED Final   Influenza B NOT DETECTED NOT DETECTED Final   Parainfluenza Virus 1 NOT DETECTED NOT DETECTED Final   Parainfluenza Virus 2 NOT DETECTED NOT DETECTED Final   Parainfluenza Virus 3 NOT DETECTED NOT DETECTED Final   Parainfluenza Virus 4 NOT DETECTED NOT DETECTED Final   Respiratory Syncytial Virus NOT DETECTED NOT DETECTED Final    Bordetella pertussis NOT DETECTED NOT DETECTED Final   Chlamydophila pneumoniae NOT DETECTED NOT DETECTED Final   Mycoplasma pneumoniae NOT DETECTED NOT DETECTED Final    Comment: Performed at [facility], [place] [address], [place], [place] [address]     Studies: Dg Chest 1 View  Result Date: 10/07/2017 CLINICAL DATA:  Cough for 2 weeks.  Fever. EXAM: CHEST 1 VIEW COMPARISON:  August 28, 2015 FINDINGS: There is patchy infiltrate in the left base. Lungs elsewhere are clear. Heart size and pulmonary vascularity are normal. No adenopathy. No appreciable bone lesions. IMPRESSION: Patchy infiltrate left base felt to represent pneumonia. Lungs elsewhere clear. Heart size normal. Electronically Signed   By: Lowella Grip III M.D.   On: 10/07/2017 13:44    Scheduled Meds: . diphenhydrAMINE  12.5 mg Intravenous Q6H  . ferrous sulfate  650 mg Oral Q breakfast  . guaiFENesin  600 mg Oral BID  . ipratropium-albuterol  3 mL Nebulization BID  . latanoprost  1 drop Left Eye QHS  . multivitamin with minerals  1 tablet Oral Daily  . ranitidine  150 mg Oral BID  .  senna  1 tablet Oral BID  . simvastatin  40 mg Oral QPM  . sodium chloride flush  3 mL Intravenous Q12H   Continuous Infusions: . sodium chloride    . sodium chloride 125 mL/hr at 10/08/17 1153  . azithromycin    . cefTRIAXone (ROCEPHIN)  IV 2 g (10/08/17 1401)    Time spent: 25 minutes    [facility] Pager [phone]. If 7PM-7AM, please contact night-coverage at [email or web address], password [facility] 10/08/2017, 2:43 PM  LOS: 1 day

## 2017-10-08 NOTE — Progress Notes (Signed)
Safety sitter placed at patient bedside. Patient frequently sits up in bed and leans toward bed rail and bed alarm will not set due to patient weight being too low. Patient not following commands and due to her stature being so small and to ensure safety, placed bed mats on floor. Patient resting at this time. Will c/t monitor.

## 2017-10-08 NOTE — Progress Notes (Signed)
Unable to complete all of admission, pt is nonverbal and family is not present. Will let day shift know to complete if family available.

## 2017-10-08 NOTE — Progress Notes (Signed)
Patient transferred to low bed with side mats on floor.  Patient tolerated well.

## 2017-10-09 ENCOUNTER — Other Ambulatory Visit: Payer: Self-pay

## 2017-10-09 DIAGNOSIS — G809 Cerebral palsy, unspecified: Secondary | ICD-10-CM | POA: Diagnosis present

## 2017-10-09 DIAGNOSIS — R8271 Bacteriuria: Secondary | ICD-10-CM | POA: Diagnosis present

## 2017-10-09 DIAGNOSIS — E872 Acidosis, unspecified: Secondary | ICD-10-CM | POA: Diagnosis present

## 2017-10-09 DIAGNOSIS — R21 Rash and other nonspecific skin eruption: Secondary | ICD-10-CM | POA: Diagnosis present

## 2017-10-09 LAB — CBC
HEMATOCRIT: 37.1 % (ref 36.0–46.0)
Hemoglobin: 11.7 g/dL — ABNORMAL LOW (ref 12.0–15.0)
MCH: 23.6 pg — ABNORMAL LOW (ref 26.0–34.0)
MCHC: 31.5 g/dL (ref 30.0–36.0)
MCV: 74.8 fL — ABNORMAL LOW (ref 78.0–100.0)
PLATELETS: 183 10*3/uL (ref 150–400)
RBC: 4.96 MIL/uL (ref 3.87–5.11)
RDW: 16.9 % — AB (ref 11.5–15.5)
WBC: 9.3 10*3/uL (ref 4.0–10.5)

## 2017-10-09 LAB — BASIC METABOLIC PANEL
Anion gap: 6 (ref 5–15)
BUN: 6 mg/dL (ref 6–20)
CALCIUM: 9.1 mg/dL (ref 8.9–10.3)
CO2: 24 mmol/L (ref 22–32)
CREATININE: 0.77 mg/dL (ref 0.44–1.00)
Chloride: 110 mmol/L (ref 101–111)
Glucose, Bld: 120 mg/dL — ABNORMAL HIGH (ref 65–99)
Potassium: 3.6 mmol/L (ref 3.5–5.1)
Sodium: 140 mmol/L (ref 135–145)

## 2017-10-09 LAB — LACTIC ACID, PLASMA: Lactic Acid, Venous: 1.6 mmol/L (ref 0.5–1.9)

## 2017-10-09 MED ORDER — HYDROXYZINE HCL 25 MG PO TABS: 25.0000 mg | ORAL_TABLET | Freq: Four times a day (QID) | ORAL | 0 refills | Status: DC | PRN

## 2017-10-09 MED ORDER — GUAIFENESIN-DM 100-10 MG/5ML PO SYRP: 15.0000 mL | ORAL_SOLUTION | Freq: Four times a day (QID) | ORAL | 0 refills | Status: AC

## 2017-10-09 MED ORDER — CHLORHEXIDINE GLUCONATE 0.12 % MT SOLN
15.0000 mL | Freq: Two times a day (BID) | OROMUCOSAL | Status: DC
  Administered 2017-10-09: 15 mL via OROMUCOSAL
  Filled 2017-10-09: qty 15

## 2017-10-09 MED ORDER — AZITHROMYCIN 250 MG PO TABS: 500.0000 mg | ORAL_TABLET | Freq: Every day | ORAL | Status: DC

## 2017-10-09 MED ORDER — SENNA 8.6 MG PO TABS: 1.0000 | ORAL_TABLET | Freq: Two times a day (BID) | ORAL | 0 refills | Status: DC

## 2017-10-09 MED ORDER — ORAL CARE MOUTH RINSE
15.0000 mL | Freq: Two times a day (BID) | OROMUCOSAL | Status: DC
  Administered 2017-10-09: 15 mL via OROMUCOSAL

## 2017-10-09 MED ORDER — ALBUTEROL SULFATE HFA 108 (90 BASE) MCG/ACT IN AERS: 2.0000 | INHALATION_SPRAY | Freq: Four times a day (QID) | RESPIRATORY_TRACT | 0 refills | Status: DC | PRN

## 2017-10-09 MED ORDER — RANITIDINE HCL 150 MG/10ML PO SYRP: 150.0000 mg | ORAL_SOLUTION | Freq: Two times a day (BID) | ORAL | 0 refills | Status: DC

## 2017-10-09 MED ORDER — AMOXICILLIN-POT CLAVULANATE 875-125 MG PO TABS: 1.0000 | ORAL_TABLET | Freq: Two times a day (BID) | ORAL | 0 refills | Status: AC

## 2017-10-09 MED ORDER — GUAIFENESIN-DM 100-10 MG/5ML PO SYRP
15.0000 mL | ORAL_SOLUTION | Freq: Four times a day (QID) | ORAL | Status: DC
  Administered 2017-10-09: 15 mL via ORAL
  Filled 2017-10-09: qty 20

## 2017-10-09 NOTE — Discharge Summary (Signed)
Physician Discharge Summary  Maria [name] VEH:[id number] DOB: [birth year] DOA: 10/07/2017  PCP: Gregor Hams, FNP  Admit date: 10/07/2017 Discharge date: 10/09/2017  Time spent: 60 minutes  Recommendations for Outpatient Follow-up:  1. Follow-up with Gregor Hams, FNP in 1-2 weeks.   Discharge Diagnoses:  Principal Problem:   CAP (community acquired pneumonia) Active Problems:   Mental retardation   Pyrexia   Difficult intravenous access   Pneumonia   Rash   Cerebral palsy ([place])   Bacteria in urine   Lactic acidosis   Discharge Condition: Stable and improved  Diet recommendation: Regular  Filed Weights   10/07/17 1854  Weight: 29.9 kg (65 lb 14.7 oz)    History of present illness:  Per Dr Charlotte Crumb  is a 42 y.o. female with past medical history relevant for cerebral palsy and unidentified severe developmental delayed who presented to the ED due to concerns about recurrent fevers. Patient is nonverbal at baseline, history obtained from patient's mother and sister at bedside, apparently patient has had recurrent fevers, she was diagnosed with left-sided pneumonia on 08/16/17 at  [facility] clinic and treated with azithromycin, apparently fever resolved, patient improved overall, now the fevers are back to  On day of admission, in the ED again the concerns about fevers and elevated lactic acid, however no leukocytosis at this time, influenza was negative.  she was seen at [facility] on 09/28/2017 and started on Macrobid for presumed UTI without a urine test at that time based on foul-smelling urine, patient's UA today does not suggest UTI at this time ,   Patient started itching today, she was noted to have an erythematous macular papular rash on extremities, ???  Viral exanthem versus drug reaction,     Hospital Course:  1-left lung community-acquired pneumonia -Patient was admitted with recurrent fevers and intermittent productive coughing  spells.  Chest x-ray done on admission was concerning for pneumonia.  Patient was placed empirically on IV Rocephin and IV azithromycin.  Patient improved clinically.  Cough improved.  Patient remained afebrile.  Patient had no nausea or vomiting and required no further oxygen supplementation.  Influenza PCR was negative.  Respiratory viral panel was also negative.  Patient improved clinically and will be discharged home on 5 more days of oral Augmentin to complete a one-week course of antibiotic treatment.   2-Bacteria in urine -Patient noted prior to admission was started on antibiotic for foul-smelling urine by PCP.  It was noted on admission that urine cultures were not done at that time.  Patient denied any dysuria.  Patient was placed empirically on IV antibiotics for problem #1 which would also cover urinary tract infection.  Urine cultures were obtained which were negative.  Outpatient follow-up.   3-erythematous rash: Noted on admission -Completely resolved with the use of antihistamines -Unclear etiology at this point.  Outpatient follow-up.  4-cerebral palsy with mental retardation: -Remained stable and close to baseline during the hospitalization.    5-lactic acidosis -Most likely associated with dehydration in the setting of acute infection.  Patient was hydrated with IV fluids with resolution of lactic acidosis.  Outpatient follow-up.    Procedures:  Chest x-ray 10/07/2017  Consultations:  None  Discharge Exam: Vitals:   10/08/17 2235 10/09/17 0504  BP: 122/73 104/79  Pulse: (!) 105 99  Resp: 20 20  Temp: 98.5 F (36.9 C) 98.2 F (36.8 C)  SpO2: 100% 100%    General: NAD Cardiovascular: RRR Respiratory: CTAB  Discharge Instructions  Discharge Instructions    Diet general   Complete by:  As directed    Increase activity slowly   Complete by:  As directed      Allergies as of 10/09/2017   No Known Allergies     Medication List    STOP taking  these medications   nitrofurantoin (macrocrystal-monohydrate) 100 MG capsule Commonly known as:  MACROBID     TAKE these medications   albuterol 108 (90 Base) MCG/ACT inhaler Commonly known as:  PROVENTIL HFA;VENTOLIN HFA Inhale 2 puffs into the lungs every 6 (six) hours as needed for wheezing or shortness of breath. Use 2 puffs 3 times daily x 5 days, then every 6 hours as needed.   amoxicillin-clavulanate 875-125 MG tablet Commonly known as:  AUGMENTIN Take 1 tablet by mouth 2 (two) times daily for 5 days.   ferrous sulfate 325 (65 FE) MG tablet Take 650 mg by mouth daily with breakfast.   guaiFENesin-dextromethorphan 100-10 MG/5ML syrup Commonly known as:  ROBITUSSIN DM Take 15 mLs by mouth every 6 (six) hours for 5 days.   hydrOXYzine 25 MG tablet Commonly known as:  ATARAX/VISTARIL Take 1 tablet (25 mg total) by mouth every 6 (six) hours as needed for itching or anxiety.   latanoprost 0.005 % ophthalmic solution Commonly known as:  XALATAN Place 1 drop into the left eye at bedtime.   multivitamin with minerals Tabs tablet Take 1 tablet by mouth daily.   ranitidine 150 MG/10ML syrup Commonly known as:  ZANTAC Take 10 mLs (150 mg total) by mouth 2 (two) times daily.   senna 8.6 MG Tabs tablet Commonly known as:  SENOKOT Take 1 tablet (8.6 mg total) by mouth 2 (two) times daily.   simvastatin 40 MG tablet Commonly known as:  ZOCOR Take 40 mg by mouth every evening.      No Known Allergies Follow-up Information    Gregor Hams, FNP. Schedule an appointment as soon as possible for a visit in 2 week(s).   Specialty:  Family Medicine Why:  f/u in 1-2 weeks. Contact information: [place] [address] [phone]            The results of significant diagnostics from this hospitalization (including imaging, microbiology, ancillary and laboratory) are listed below for reference.    Significant Diagnostic Studies: Dg Chest 1  View  Result Date: 10/07/2017 CLINICAL DATA:  Cough for 2 weeks.  Fever. EXAM: CHEST 1 VIEW COMPARISON:  August 28, 2015 FINDINGS: There is patchy infiltrate in the left base. Lungs elsewhere are clear. Heart size and pulmonary vascularity are normal. No adenopathy. No appreciable bone lesions. IMPRESSION: Patchy infiltrate left base felt to represent pneumonia. Lungs elsewhere clear. Heart size normal. Electronically Signed   By: Lowella Grip III M.D.   On: 10/07/2017 13:44    Microbiology: Recent Results (from the past 240 hour(s))  Culture, blood (Routine x 2)     Status: None (Preliminary result)   Collection Time: 10/07/17  1:05 PM  Result Value Ref Range Status   Specimen Description BLOOD BLOOD LEFT ARM  Final   Special Requests IN PEDIATRIC BOTTLE Blood Culture adequate volume  Final   Culture   Final    NO GROWTH < 24 HOURS Performed at [facility], [place] [address], [place], [place] [address]    Report Status PENDING  Incomplete  Culture, blood (Routine x 2)     Status: None (Preliminary result)   Collection  Time: 10/07/17  1:17 PM  Result Value Ref Range Status   Specimen Description BLOOD LEFT ARM  Final   Special Requests IN PEDIATRIC BOTTLE Blood Culture adequate volume  Final   Culture   Final    NO GROWTH < 24 HOURS Performed at [facility], [place] [address] [place], [place] [address]    Report Status PENDING  Incomplete  Respiratory Panel by PCR     Status: None   Collection Time: 10/07/17  4:26 PM  Result Value Ref Range Status   Adenovirus NOT DETECTED NOT DETECTED Final   Coronavirus 229E NOT DETECTED NOT DETECTED Final   Coronavirus HKU1 NOT DETECTED NOT DETECTED Final   Coronavirus NL63 NOT DETECTED NOT DETECTED Final   Coronavirus OC43 NOT DETECTED NOT DETECTED Final   Metapneumovirus NOT DETECTED NOT DETECTED Final   Rhinovirus / Enterovirus NOT DETECTED NOT DETECTED Final   Influenza A NOT DETECTED NOT DETECTED Final   Influenza B NOT  DETECTED NOT DETECTED Final   Parainfluenza Virus 1 NOT DETECTED NOT DETECTED Final   Parainfluenza Virus 2 NOT DETECTED NOT DETECTED Final   Parainfluenza Virus 3 NOT DETECTED NOT DETECTED Final   Parainfluenza Virus 4 NOT DETECTED NOT DETECTED Final   Respiratory Syncytial Virus NOT DETECTED NOT DETECTED Final   Bordetella pertussis NOT DETECTED NOT DETECTED Final   Chlamydophila pneumoniae NOT DETECTED NOT DETECTED Final   Mycoplasma pneumoniae NOT DETECTED NOT DETECTED Final    Comment: Performed at [facility], [place] [address] [place], [place] [address]  Urine culture     Status: None   Collection Time: 10/07/17  5:00 PM  Result Value Ref Range Status   Specimen Description URINE, CATHETERIZED  Final   Special Requests NONE  Final   Culture   Final    NO GROWTH Performed at [facility], [address] [place], [place] [address]    Report Status 10/08/2017 FINAL  Final     Labs: Basic Metabolic Panel: Recent Labs  Lab 10/07/17 1302 10/08/17 0603 10/09/17 0609  NA 141 144 140  K 4.6 3.7 3.6  CL 107 114* 110  CO2 24 23 24   GLUCOSE 119* 113* 120*  BUN 7 <5* 6  CREATININE 0.80 0.78 0.77  CALCIUM 9.7 8.3* 9.1   Liver Function Tests: Recent Labs  Lab 10/07/17 1302  AST 27  ALT 14  ALKPHOS 101  BILITOT 1.5*  PROT 8.4*  ALBUMIN 4.2   No results for input(s): LIPASE, AMYLASE in the last 168 hours. No results for input(s): AMMONIA in the last 168 hours. CBC: Recent Labs  Lab 10/07/17 1302 10/08/17 0603 10/09/17 0609  WBC 10.4 9.3 9.3  NEUTROABS 7.4  --   --   HGB 13.7 11.1* 11.7*  HCT 43.4 35.6* 37.1  MCV 75.2* 76.4* 74.8*  PLT 222 126* 183   Cardiac Enzymes: No results for input(s): CKTOTAL, CKMB, CKMBINDEX, TROPONINI in the last 168 hours. BNP: BNP (last 3 results) No results for input(s): BNP in the last 8760 hours.  ProBNP (last 3 results) No results for input(s): PROBNP in the last 8760 hours.  CBG: No results for input(s):  GLUCAP in the last 168 hours.     Signed:  Irine Seal MD.  [facility] 10/09/2017, 3:22 PM

## 2017-10-09 NOTE — Progress Notes (Signed)
Pt discharged to home with mom and sister at bedside.  Vitals stable.  Discharge instructions discussed with mother.  Verbalized understanding.  Afebrile.    Iantha Fallen RN 5:39 PM 10/09/2017

## 2017-10-09 NOTE — Progress Notes (Signed)
PHARMACIST - PHYSICIAN COMMUNICATION DR:   [place] Silos CONCERNING: Antibiotic IV to Oral Route Change Policy  RECOMMENDATION: This patient is receiving azithromycin by the intravenous route.  Based on criteria approved by the Pharmacy and Therapeutics Committee, the antibiotic(s) is/are being converted to the equivalent oral dose form(s).   DESCRIPTION: These criteria include:  Patient being treated for a respiratory tract infection, urinary tract infection, cellulitis or clostridium difficile associated diarrhea if on metronidazole  The patient is not neutropenic and does not exhibit a GI malabsorption state  The patient is eating (either orally or via tube) and/or has been taking other orally administered medications for a least 24 hours  The patient is improving clinically and has a Tmax < 100.5  If you have questions about this conversion, please contact the Pharmacy Department  []   [phone] )  [facility] []   [phone] )  [facility] []   [phone] )  [facility] []   [phone] )  [facility] []   [phone] )  [place], [place].D. [phone] 10/09/2017 1:00 PM

## 2017-10-12 LAB — CULTURE, BLOOD (ROUTINE X 2)
CULTURE: NO GROWTH
Culture: NO GROWTH
SPECIAL REQUESTS: ADEQUATE
Special Requests: ADEQUATE

## 2017-10-28 ENCOUNTER — Emergency Department (HOSPITAL_COMMUNITY): Payer: Medicare Other

## 2017-10-28 ENCOUNTER — Encounter (HOSPITAL_COMMUNITY): Payer: Self-pay

## 2017-10-28 ENCOUNTER — Emergency Department (HOSPITAL_COMMUNITY)
Admission: EM | Admit: 2017-10-28 | Discharge: 2017-10-29 | Disposition: A | Discharge status: Home / Self Care | Payer: Medicare Other | Attending: Emergency Medicine | Admitting: Emergency Medicine

## 2017-10-28 DIAGNOSIS — Z79899 Other long term (current) drug therapy: Secondary | ICD-10-CM | POA: Diagnosis not present

## 2017-10-28 DIAGNOSIS — F70 Mild intellectual disabilities: Secondary | ICD-10-CM | POA: Insufficient documentation

## 2017-10-28 DIAGNOSIS — R509 Fever, unspecified: Secondary | ICD-10-CM | POA: Diagnosis present

## 2017-10-28 DIAGNOSIS — R69 Illness, unspecified: Secondary | ICD-10-CM

## 2017-10-28 LAB — BASIC METABOLIC PANEL
ANION GAP: 8 (ref 5–15)
BUN: 7 mg/dL (ref 6–20)
CHLORIDE: 105 mmol/L (ref 101–111)
CO2: 27 mmol/L (ref 22–32)
Calcium: 9.5 mg/dL (ref 8.9–10.3)
Creatinine, Ser: 0.75 mg/dL (ref 0.44–1.00)
GFR calc Af Amer: >60 mL/min (ref >60)
GFR calc non Af Amer: >60 mL/min (ref >60)
GLUCOSE: 118 mg/dL — AB (ref 65–99)
POTASSIUM: 3.4 mmol/L — AB (ref 3.5–5.1)
Sodium: 140 mmol/L (ref 135–145)

## 2017-10-28 LAB — URINALYSIS, ROUTINE W REFLEX MICROSCOPIC
Bilirubin Urine: NEGATIVE
GLUCOSE, UA: NEGATIVE mg/dL
Hgb urine dipstick: NEGATIVE
Ketones, ur: 80 mg/dL — AB
LEUKOCYTES UA: NEGATIVE
Nitrite: NEGATIVE
PH: 6 (ref 5.0–8.0)
Protein, ur: NEGATIVE mg/dL
Specific Gravity, Urine: 1.012 (ref 1.005–1.030)

## 2017-10-28 LAB — HEPATIC FUNCTION PANEL
ALT: 25 U/L (ref 14–54)
AST: 30 U/L (ref 15–41)
Albumin: 4.2 g/dL (ref 3.5–5.0)
Alkaline Phosphatase: 78 U/L (ref 38–126)
BILIRUBIN DIRECT: 0.1 mg/dL (ref 0.1–0.5)
BILIRUBIN INDIRECT: 0.8 mg/dL (ref 0.3–0.9)
TOTAL PROTEIN: 7.9 g/dL (ref 6.5–8.1)
Total Bilirubin: 0.9 mg/dL (ref 0.3–1.2)

## 2017-10-28 LAB — CBC WITH DIFFERENTIAL/PLATELET
BASOS ABS: 0.1 10*3/uL (ref 0.0–0.1)
Basophils Relative: 1 %
EOS PCT: 1 %
Eosinophils Absolute: 0.1 10*3/uL (ref 0.0–0.7)
HEMATOCRIT: 38.6 % (ref 36.0–46.0)
Hemoglobin: 12.4 g/dL (ref 12.0–15.0)
LYMPHS ABS: 1.8 10*3/uL (ref 0.7–4.0)
LYMPHS PCT: 23 %
MCH: 23.8 pg — AB (ref 26.0–34.0)
MCHC: 32.1 g/dL (ref 30.0–36.0)
MCV: 74.1 fL — AB (ref 78.0–100.0)
MONO ABS: 0.5 10*3/uL (ref 0.1–1.0)
MONOS PCT: 7 %
NEUTROS ABS: 5.2 10*3/uL (ref 1.7–7.7)
Neutrophils Relative %: 68 %
Platelets: 250 10*3/uL (ref 150–400)
RBC: 5.21 MIL/uL — ABNORMAL HIGH (ref 3.87–5.11)
RDW: 17.1 % — AB (ref 11.5–15.5)
WBC: 7.6 10*3/uL (ref 4.0–10.5)

## 2017-10-28 LAB — I-STAT BETA HCG BLOOD, ED (MC, WL, AP ONLY): I-stat hCG, quantitative: <5 m[IU]/mL (ref <5)

## 2017-10-28 LAB — INFLUENZA PANEL BY PCR (TYPE A & B)
INFLAPCR: NEGATIVE
Influenza B By PCR: NEGATIVE

## 2017-10-28 LAB — I-STAT CG4 LACTIC ACID, ED: Lactic Acid, Venous: 2.71 mmol/L (ref 0.5–1.9)

## 2017-10-28 MED ORDER — LORAZEPAM 2 MG/ML IJ SOLN
1.0000 mg | Freq: Once | INTRAMUSCULAR | Status: AC
  Administered 2017-10-28: 1 mg via INTRAMUSCULAR
  Filled 2017-10-28: qty 1

## 2017-10-28 MED ORDER — MIDAZOLAM HCL 2 MG/2ML IJ SOLN: 1.0000 mg | Freq: Once | INTRAMUSCULAR | Status: DC

## 2017-10-28 MED ORDER — SODIUM CHLORIDE 0.9 % IV BOLUS (SEPSIS)
500.0000 mL | Freq: Once | INTRAVENOUS | Status: AC
  Administered 2017-10-28: 500 mL via INTRAVENOUS

## 2017-10-28 NOTE — ED Notes (Signed)
Pt is hard stick, consulted [place] trained Nurses in ED will come and attempt for me

## 2017-10-28 NOTE — ED Provider Notes (Signed)
[facility] DEPT Provider Note   CSN: [id number] Arrival date & time: 10/28/17  1120     History   Chief Complaint Chief Complaint  Patient presents with  . Cough  . Fever    HPI Maria [name] is a 42 y.o. female with developmental delays, cervical palsy, who presents today for evaluation of subjective fevers at home and cough.  Patient is nonverbal, history obtained from mother.  Mom reports that patient was just treated for community-acquired pneumonia.  Today she has had poor fluid intake, decreased appetite, and has reportedly foul-smelling urine.  She has not had any ibuprofen or Tylenol prior to arrival.  Mom reports that her cough went away after being treated but has been back for the past 1-2 days.  No diarrhea, constipation or vomiting.  Patient is acting her usual self per mother.    HPI  Past Medical History:  Diagnosis Date  . Glaucoma   . Mental retardation   . Renal disorder     Patient Active Problem List   Diagnosis Date Noted  . Rash 10/09/2017  . Cerebral palsy ([place]) 10/09/2017  . Bacteria in urine 10/09/2017  . Lactic acidosis 10/09/2017  . Pneumonia 10/07/2017  . Respiratory failure, acute ([place]) 08/28/2015  . Pyrexia   . Difficult intravenous access   . Sepsis ([place]) 08/27/2015  . Mental retardation 08/27/2015  . CAP (community acquired pneumonia) 08/27/2015    Past Surgical History:  Procedure Laterality Date  . CLEFT PALATE REPAIR    . EYE SURGERY      OB History    No data available       Home Medications    Prior to Admission medications   Medication Sig Start Date End Date Taking? Authorizing Provider  albuterol (PROVENTIL HFA;VENTOLIN HFA) 108 (90 Base) MCG/ACT inhaler Inhale 2 puffs into the lungs every 6 (six) hours as needed for wheezing or shortness of breath. Use 2 puffs 3 times daily x 5 days, then every 6 hours as needed. 10/09/17  Yes Eugenie Filler, MD  ferrous sulfate 325 (65 FE) MG  tablet Take 650 mg by mouth daily with breakfast.   Yes [provider]  hydrOXYzine (ATARAX/VISTARIL) 25 MG tablet Take 1 tablet (25 mg total) by mouth every 6 (six) hours as needed for itching or anxiety. 10/09/17  Yes Eugenie Filler, MD  latanoprost (XALATAN) 0.005 % ophthalmic solution Place 1 drop into the left eye at bedtime. 02/18/15  Yes [provider]  Multiple Vitamin (MULTIVITAMIN WITH MINERALS) TABS tablet Take 1 tablet by mouth daily.   Yes [provider]  senna (SENOKOT) 8.6 MG TABS tablet Take 1 tablet (8.6 mg total) by mouth 2 (two) times daily. 10/09/17  Yes Eugenie Filler, MD  simvastatin (ZOCOR) 40 MG tablet Take 40 mg by mouth every evening.   Yes [provider]  ranitidine (ZANTAC) 150 MG/10ML syrup Take 10 mLs (150 mg total) by mouth 2 (two) times daily. Patient not taking: Reported on 10/28/2017 10/09/17   Eugenie Filler, MD    Family History History reviewed. No pertinent family history.  Social History Social History   Tobacco Use  . Smoking status: Never Smoker  . Smokeless tobacco: Never Used  Substance Use Topics  . Alcohol use: No    Frequency: Never  . Drug use: No     Allergies   Patient has no known allergies.   Review of Systems Review of Systems  Unable to  perform ROS: Patient nonverbal     Physical Exam Updated Vital Signs BP (!) 150/92 (BP Location: Left Arm)   Pulse 100   Temp 98.9 F (37.2 C) (Rectal)   Resp 19   Ht 4' (1.219 m)   Wt 25.4 kg (56 lb)   LMP 09/27/2017   SpO2 94%   BMI 17.09 kg/m   Physical Exam  Constitutional: She appears well-nourished. No distress.  Patient is very small statured, approximately 4 feet tall weighing approximately 56 pounds, does not appears stated age.  HENT:  Head: Atraumatic.  Head is dysmorphic  Eyes: Conjunctivae are normal. Right eye exhibits no discharge. Left eye exhibits no discharge. No scleral icterus.  Neck: Normal range of motion.  Neck supple.  Cardiovascular: Normal rate, regular rhythm and intact distal pulses.  Pulmonary/Chest: Effort normal and breath sounds normal. No stridor. No respiratory distress. She has no wheezes.  Abdominal: Soft. Bowel sounds are normal. She exhibits no distension. There is no tenderness. There is no guarding.  Musculoskeletal: She exhibits no edema or deformity.  Neurological: She is alert.  Patient is alert, interacts with provider, non verbal, moves all 4 extremities spontaneously, resists mouth evaluation by tightly shutting her mouth/lips.    Skin: Skin is warm and dry. She is not diaphoretic.  Psychiatric:  Calm, self soothing with thumb sucking.   Nursing note and vitals reviewed.    ED Treatments / Results  Labs (all labs ordered are listed, but only abnormal results are displayed) Labs Reviewed  URINALYSIS, ROUTINE W REFLEX MICROSCOPIC - Abnormal; Notable for the following components:      Result Value   Color, Urine STRAW (*)    Ketones, ur 80 (*)    All other components within normal limits  BASIC METABOLIC PANEL - Abnormal; Notable for the following components:   Potassium 3.4 (*)    Glucose, Bld 118 (*)    All other components within normal limits  CBC WITH DIFFERENTIAL/PLATELET - Abnormal; Notable for the following components:   RBC 5.21 (*)    MCV 74.1 (*)    MCH 23.8 (*)    RDW 17.1 (*)    All other components within normal limits  I-STAT CG4 LACTIC ACID, ED - Abnormal; Notable for the following components:   Lactic Acid, Venous 2.71 (*)    All other components within normal limits  CULTURE, BLOOD (ROUTINE X 2)  CULTURE, BLOOD (ROUTINE X 2)  URINE CULTURE  INFLUENZA PANEL BY PCR (TYPE A & B)  HEPATIC FUNCTION PANEL  LACTIC ACID, PLASMA  I-STAT BETA HCG BLOOD, ED ([facility], [facility], [facility] ONLY)  I-STAT CG4 LACTIC ACID, ED    EKG  EKG Interpretation  Date/Time:  Saturday October 28 2017 21:04:06 EST Ventricular Rate:  97 PR Interval:    QRS Duration: 54 QT  Interval:  478 QTC Calculation: 608 R Axis:   56 Text Interpretation:  Sinus rhythm Ventricular premature complex Aberrant conduction of SV complex(es) Short PR interval Borderline T abnormalities, diffuse leads Prolonged QT interval No old tracing to compare Confirmed by Malvin Johns [phone]) on 10/29/2017 9:22:22 PM       Radiology Dg Chest 2 View  Result Date: 10/28/2017 CLINICAL DATA:  42 year old female with recent infection and acidosis. Recurrent fever. EXAM: CHEST  2 VIEW COMPARISON:  Prior chest x-ray 10/07/2017 FINDINGS: Significant improvement in the aeration of the lungs, particularly the left lung base. Persistent mild bronchitic changes. Stable cardiac and mediastinal contours. Rotary and dextroconvex scoliosis. No evidence  of bowel obstruction or free air. No acute osseous abnormality. IMPRESSION: No active cardiopulmonary disease. Interval resolution of patchy left lower lobe airspace opacity compared to 10/07/2017. Electronically Signed   By: Jacqulynn Cadet M.D.   On: 10/28/2017 12:14    Procedures Procedures (including critical care time)  Medications Ordered in ED Medications  LORazepam (ATIVAN) injection 1 mg (1 mg Intramuscular Given 10/28/17 1919)  sodium chloride 0.9 % bolus 500 mL (0 mLs Intravenous Stopped 10/28/17 2149)     Initial Impression / Assessment and Plan / ED Course  I have reviewed the triage vital signs and the nursing notes.  Pertinent labs & imaging results that were available during my care of the patient were reviewed by me and considered in my medical decision making (see chart for details).  Clinical Course as of Oct 30 10  Sat Oct 28, 2017  2335 Updated patient's mother on plan of care  [EH]    Clinical Course User Index [EH] Lorin Glass, PA-C   [name] presents today for evaluation of subjective fevers and reported decreased PO intake.  She was afebrile here, not consistently tachycardic, or tachypneic.  Labs obtained.   White count is not elevated, she is not anemic.  Initially she did have a mild lactic acidosis at 2.71.  Blood cultures were obtained.  UA without obvious signs of infection, nitrite, leukocyte, and hemoglobin negative, will send for urine culture as patient is nonverbal.  Chest x-ray obtained showing resolution of previously identified pneumonia.  While in the emergency room patient was given Ativan IM to facilitate in and out cath, venous accesses patient was a very difficult stick.  Patient remained afebrile throughout her stay in the emergency room.  Did have minimally elevated blood pressures, and her mother was advised to follow-up with primary care provider.  Patient signed out to Dr. Johnney Killian to follow up on repeat  Lactic acid.    Final Clinical Impressions(s) / ED Diagnoses   Final diagnoses:  Fever, unspecified fever cause  Severe comorbid illness    ED Discharge Orders    None       Ollen Gross 10/30/17 0014    Charlesetta Shanks, MD 11/03/17 [phone]

## 2017-10-28 NOTE — ED Notes (Signed)
Pt care delayed bc pt is hard stick

## 2017-10-28 NOTE — ED Triage Notes (Signed)
Pt here with cough and possible fever.  Decreased oral intake.  Family states urine has odor to it.  Has home nurse that thought she felt warm.

## 2017-10-29 DIAGNOSIS — R509 Fever, unspecified: Secondary | ICD-10-CM | POA: Diagnosis not present

## 2017-10-29 LAB — LACTIC ACID, PLASMA: Lactic Acid, Venous: 1.4 mmol/L (ref 0.5–1.9)

## 2017-10-29 NOTE — ED Provider Notes (Signed)
Medical screening examination/treatment/procedure(s) were conducted as a shared visit with non-physician practitioner(s) and myself.  I personally evaluated the patient during the encounter.   EKG Interpretation None     Subjective fever at home.  Patient's mother reports 1 of her home nursing staff felt the patient was subjectively hot to the touch.  They advised evaluation in the emergency department.  Patient's mother notes that she has had some cough.  She reports that she ate a good breakfast but has not been wanting to drink as much.  Family notes odor to her urine.  Patient has stigmata of severe physical underdevelopment for age.  She is however alert and sitting up in the stretcher gazing at her mother.  She is nontoxic.  She exhibits no respiratory distress.  Lungs are clear.  Heart is regular.  Extremities are extremely small for age and size but without edema.  Diagnostic workup is not suggestive of bacterial infection at this time.  Chest x-ray looks improved relative to prior admission.  Urinalysis is negative.  Other diagnostic studies are stable.  Influenza swab is negative. At this time, consideration is given for possible viral illness.  Patient stable and will have her continue to be monitored at home.  Blood cultures will be pending.   Charlesetta Shanks, MD 10/29/17 [phone]

## 2017-10-30 LAB — URINE CULTURE: Culture: NO GROWTH

## 2017-11-03 LAB — CULTURE, BLOOD (ROUTINE X 2)
CULTURE: NO GROWTH
Culture: NO GROWTH

## 2018-02-12 NOTE — H&P (Addendum)
Maria [name] is an 42 y.o. female.   Chief Complaint: Need to eye pressures and function. HPI: 42 Y/O BF c neurodevelopmental delay :Failure to thrive and H/O of congenital glaucoma needs evaluation of ocular status and IOP ou:  Past Medical History:  Diagnosis Date  . Glaucoma   . Mental retardation   . Renal disorder     Past Surgical History:  Procedure Laterality Date  . CLEFT PALATE REPAIR    . EYE SURGERY      No family history on file. Social History:  reports that she has never smoked. She has never used smokeless tobacco. She reports that she does not drink alcohol or use drugs.  Allergies: No Known Allergies  No medications prior to admission.    No results found for this or any previous visit (from the past 48 hour(s)). No results found.  Review of Systems  Constitutional: Negative.   HENT: Negative.   Eyes: Positive for blurred vision.        Vision loss  Cardiovascular: Negative.   Neurological: Positive for sensory change and speech change.       Neurodevelopmental  delay    There were no vitals taken for this visit. Physical Exam  Eyes:  Loss of VA  Ou  Exotropia    Neurological: She is alert.     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  Gevena Cotton, MD 02/12/2018, 12:43 PM

## 2018-02-13 ENCOUNTER — Encounter (HOSPITAL_COMMUNITY): Payer: Self-pay | Admitting: *Deleted

## 2018-02-13 ENCOUNTER — Other Ambulatory Visit: Payer: Self-pay

## 2018-02-13 NOTE — Progress Notes (Signed)
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 Last office visit 10/05/17 documents pt referred for abnormal EKG.  Echo ordered (results below).  Note documents "echocardiogram to assess LV systolic function and to rule out any evidence of congential heart defect. If unremarkable, ok to proceed with eye exam under general anesthesia" (notes in care everywhere)     LABS: Will be obtained day of surgery   IMAGES:  CXR 10/28/17:  - No active cardiopulmonary disease. - Interval resolution of patchy left lower lobe airspace opacity compared to 10/07/2017   EKG 10/28/17: Sinus rhythm. PVCs. Aberrant conduction of SV complex(es). Short PR interval. Borderline T abnormalities, diffuse leads. Prolonged QT interval   CV:  Echo 10/27/2017 [facility] cardiology): 1.  Study was technically difficult. 2.  LV normal in size, wall thickness and wall motion with EF 60-65%.  LV diastolic function normal. 3.  RV grossly normal in size and function. 4.  Mild tricuspid regurgitation. 5.  Pulmonary hypertension is not suggested by Doppler findings. 6.  No obvious significant functional abnormalities of the cardiac valves but with limited visualization of the valvular structure. 7.  No pericardial effusion. 8.  IVC not well-visualized.   Past Medical History:  Diagnosis Date  . Bowel incontinence   . CP (cerebral palsy) ([place])   . Glaucoma   . Hyperlipemia   . Mental retardation   . Muscular dystrophy  ([place])   . Muscular dystrophy ([place])   . Pneumonia    x 3  . Seizures ([place])    6 years none since 1987- no longer on medication , no longer sees a neurologist-   . Sepsis ([place])   . Urine incontinence     Past Surgical History:  Procedure Laterality Date  . CLEFT PALATE REPAIR    . EYE SURGERY Bilateral    Glucoma -   . NASAL SEPTUM SURGERY     lifted up    MEDICATIONS: No current facility-administered medications for this encounter.    . baclofen (LIORESAL) 10 MG tablet  . ferrous sulfate 325 (65 FE) MG tablet  . latanoprost (XALATAN) 0.005 % ophthalmic solution  . montelukast (SINGULAIR) 10 MG tablet  . Multiple Vitamin (MULTIVITAMIN WITH MINERALS) TABS tablet  . ranitidine (ZANTAC) 150 MG/10ML syrup  . simvastatin (ZOCOR) 40 MG tablet  . albuterol (PROVENTIL HFA;VENTOLIN HFA) 108 (90 Base) MCG/ACT inhaler  . hydrOXYzine (ATARAX/VISTARIL) 25 MG tablet  . senna (SENOKOT) 8.6 MG TABS tablet    If no changes, I anticipate pt can proceed with surgery as scheduled.   Willeen Cass, FNP-BC [facility] Stay Surgical Center/Anesthesiology Phone: [phone] 02/13/2018 2:03 PM

## 2018-02-14 ENCOUNTER — Ambulatory Visit (HOSPITAL_COMMUNITY): Payer: Medicare Other | Admitting: Emergency Medicine

## 2018-02-14 ENCOUNTER — Ambulatory Visit (HOSPITAL_COMMUNITY)
Admission: RE | Admit: 2018-02-14 | Discharge: 2018-02-14 | Disposition: A | Discharge status: Home / Self Care | Payer: Medicare Other | Source: Ambulatory Visit | Attending: Ophthalmology | Admitting: Ophthalmology

## 2018-02-14 ENCOUNTER — Encounter (HOSPITAL_COMMUNITY): Payer: Self-pay | Admitting: *Deleted

## 2018-02-14 ENCOUNTER — Encounter (HOSPITAL_COMMUNITY)
Admission: RE | Disposition: A | Discharge status: Home / Self Care | Payer: Self-pay | Source: Ambulatory Visit | Attending: Ophthalmology

## 2018-02-14 DIAGNOSIS — H5010 Unspecified exotropia: Secondary | ICD-10-CM | POA: Diagnosis not present

## 2018-02-14 DIAGNOSIS — R6250 Unspecified lack of expected normal physiological development in childhood: Secondary | ICD-10-CM | POA: Diagnosis not present

## 2018-02-14 DIAGNOSIS — F79 Unspecified intellectual disabilities: Secondary | ICD-10-CM | POA: Insufficient documentation

## 2018-02-14 DIAGNOSIS — R627 Adult failure to thrive: Secondary | ICD-10-CM | POA: Diagnosis not present

## 2018-02-14 DIAGNOSIS — G7100 Muscular dystrophy, unspecified: Secondary | ICD-10-CM | POA: Diagnosis not present

## 2018-02-14 DIAGNOSIS — Z681 Body mass index (BMI) 19 or less, adult: Secondary | ICD-10-CM | POA: Insufficient documentation

## 2018-02-14 DIAGNOSIS — Q150 Congenital glaucoma: Secondary | ICD-10-CM | POA: Diagnosis not present

## 2018-02-14 HISTORY — DX: Sepsis, unspecified organism: A41.9

## 2018-02-14 HISTORY — DX: Unspecified urinary incontinence: R32

## 2018-02-14 HISTORY — DX: Unspecified convulsions: R56.9

## 2018-02-14 HISTORY — DX: Muscular dystrophy, unspecified: G71.00

## 2018-02-14 HISTORY — DX: Hyperlipidemia, unspecified: E78.5

## 2018-02-14 HISTORY — DX: Full incontinence of feces: R15.9

## 2018-02-14 HISTORY — DX: Pneumonia, unspecified organism: J18.9

## 2018-02-14 HISTORY — DX: Cerebral palsy, unspecified: G80.9

## 2018-02-14 SURGERY — EXAM UNDER ANESTHESIA
Anesthesia: General | Site: Eye | Laterality: Bilateral

## 2018-02-14 MED ORDER — STERILE WATER FOR IRRIGATION IR SOLN
Status: DC | PRN
  Administered 2018-02-14: 1000 mL

## 2018-02-14 MED ORDER — PROPOFOL 10 MG/ML IV BOLUS
INTRAVENOUS | Status: AC
  Filled 2018-02-14: qty 20

## 2018-02-14 MED ORDER — SUGAMMADEX SODIUM 200 MG/2ML IV SOLN
INTRAVENOUS | Status: AC
  Filled 2018-02-14: qty 2

## 2018-02-14 MED ORDER — BSS IO SOLN
INTRAOCULAR | Status: AC
  Filled 2018-02-14: qty 15

## 2018-02-14 MED ORDER — BSS IO SOLN
INTRAOCULAR | Status: DC | PRN
  Administered 2018-02-14: 15 mL

## 2018-02-14 MED ORDER — ONDANSETRON HCL 4 MG/2ML IJ SOLN: 4.0000 mg | Freq: Once | INTRAMUSCULAR | Status: DC | PRN

## 2018-02-14 MED ORDER — DEXAMETHASONE SODIUM PHOSPHATE 10 MG/ML IJ SOLN
INTRAMUSCULAR | Status: AC
  Filled 2018-02-14: qty 1

## 2018-02-14 MED ORDER — CYCLOPENTOLATE-PHENYLEPHRINE 0.2-1 % OP SOLN
OPHTHALMIC | Status: AC
  Administered 2018-02-14: 1 [drp] via OPHTHALMIC
  Filled 2018-02-14: qty 2

## 2018-02-14 MED ORDER — ROCURONIUM BROMIDE 10 MG/ML (PF) SYRINGE
PREFILLED_SYRINGE | INTRAVENOUS | Status: AC
  Filled 2018-02-14: qty 5

## 2018-02-14 MED ORDER — FENTANYL CITRATE (PF) 100 MCG/2ML IJ SOLN: 25.0000 ug | INTRAMUSCULAR | Status: DC | PRN

## 2018-02-14 MED ORDER — FENTANYL CITRATE (PF) 250 MCG/5ML IJ SOLN
INTRAMUSCULAR | Status: AC
  Filled 2018-02-14: qty 5

## 2018-02-14 MED ORDER — PHENYLEPHRINE 40 MCG/ML (10ML) SYRINGE FOR IV PUSH (FOR BLOOD PRESSURE SUPPORT)
PREFILLED_SYRINGE | INTRAVENOUS | Status: AC
  Filled 2018-02-14: qty 10

## 2018-02-14 MED ORDER — SUCCINYLCHOLINE CHLORIDE 200 MG/10ML IV SOSY
PREFILLED_SYRINGE | INTRAVENOUS | Status: AC
  Filled 2018-02-14: qty 10

## 2018-02-14 MED ORDER — ONDANSETRON HCL 4 MG/2ML IJ SOLN
INTRAMUSCULAR | Status: DC | PRN
  Administered 2018-02-14: 4 mg via INTRAVENOUS

## 2018-02-14 MED ORDER — ONDANSETRON HCL 4 MG/2ML IJ SOLN
INTRAMUSCULAR | Status: AC
  Filled 2018-02-14: qty 2

## 2018-02-14 MED ORDER — SODIUM CHLORIDE 0.9 % IV SOLN
INTRAVENOUS | Status: DC
  Administered 2018-02-14 (×2): via INTRAVENOUS

## 2018-02-14 MED ORDER — PHENYLEPHRINE HCL 10 MG/ML IJ SOLN
INTRAMUSCULAR | Status: DC | PRN
  Administered 2018-02-14: 80 ug via INTRAVENOUS
  Administered 2018-02-14 (×3): 40 ug via INTRAVENOUS

## 2018-02-14 MED ORDER — 0.9 % SODIUM CHLORIDE (POUR BTL) OPTIME
TOPICAL | Status: DC | PRN
  Administered 2018-02-14: 1000 mL

## 2018-02-14 MED ORDER — PHENYLEPHRINE HCL 2.5 % OP SOLN
1.0000 [drp] | OPHTHALMIC | Status: AC | PRN
  Administered 2018-02-14 (×3): 1 [drp] via OPHTHALMIC

## 2018-02-14 MED ORDER — TOBRAMYCIN-DEXAMETHASONE 0.3-0.1 % OP OINT
TOPICAL_OINTMENT | OPHTHALMIC | Status: AC
  Filled 2018-02-14: qty 3.5

## 2018-02-14 MED ORDER — PROPOFOL 10 MG/ML IV BOLUS
INTRAVENOUS | Status: DC | PRN
  Administered 2018-02-14: 90 mg via INTRAVENOUS

## 2018-02-14 MED ORDER — PHENYLEPHRINE HCL 2.5 % OP SOLN
OPHTHALMIC | Status: AC
  Administered 2018-02-14: 1 [drp] via OPHTHALMIC
  Filled 2018-02-14: qty 2

## 2018-02-14 MED ORDER — CYCLOPENTOLATE-PHENYLEPHRINE 0.2-1 % OP SOLN
1.0000 [drp] | OPHTHALMIC | Status: AC | PRN
  Administered 2018-02-14 (×3): 1 [drp] via OPHTHALMIC

## 2018-02-14 MED ORDER — LACTATED RINGERS IV SOLN: INTRAVENOUS | Status: DC

## 2018-02-14 SURGICAL SUPPLY — 10 items
DRAPE SURG 17X23 STRL (DRAPES) ×6 IMPLANT
GAUZE SPONGE 4X4 12PLY STRL (GAUZE/BANDAGES/DRESSINGS) ×2 IMPLANT
GLOVE BIOGEL PI IND STRL 6.5 (GLOVE) ×2 IMPLANT
GLOVE BIOGEL PI INDICATOR 6.5 (GLOVE) ×2
GLOVE SURG SIGNA 7.5 PF LTX (GLOVE) ×2 IMPLANT
GOWN STRL REUS W/ TWL LRG LVL3 (GOWN DISPOSABLE) ×2 IMPLANT
GOWN STRL REUS W/TWL LRG LVL3 (GOWN DISPOSABLE) ×2
MARKER SKIN DUAL TIP RULER LAB (MISCELLANEOUS) ×2 IMPLANT
PACK CATARACT CUSTOM (CUSTOM PROCEDURE TRAY) ×2 IMPLANT
TOWEL GREEN STERILE FF (TOWEL DISPOSABLE) ×2 IMPLANT

## 2018-02-14 NOTE — Anesthesia Preprocedure Evaluation (Signed)
Anesthesia Evaluation  Patient identified by MRN, date of birth, ID band Patient awake    Reviewed: Allergy & Precautions, NPO status , Patient's Chart, lab work & pertinent test results  Airway Mallampati: III     Mouth opening: Pediatric Airway  Dental  (+) Dental Advisory Given   Pulmonary    breath sounds clear to auscultation       Cardiovascular  Rhythm:Regular Rate:Normal     Neuro/Psych    GI/Hepatic   Endo/Other    Renal/GU      Musculoskeletal   Abdominal   Peds  Hematology   Anesthesia Other Findings   Reproductive/Obstetrics                             Anesthesia Physical Anesthesia Plan  ASA: III  Anesthesia Plan: General   Post-op Pain Management:    Induction: Inhalational  PONV Risk Score and Plan: Ondansetron and Dexamethasone  Airway Management Planned:   Additional Equipment:   Intra-op Plan:   Post-operative Plan: Extubation in OR  Informed Consent: I have reviewed the patients History and Physical, chart, labs and discussed the procedure including the risks, benefits and alternatives for the proposed anesthesia with the patient or authorized representative who has indicated his/her understanding and acceptance.   History available from chart only and Dental advisory given  Plan Discussed with: CRNA and Anesthesiologist  Anesthesia Plan Comments:         Anesthesia Quick Evaluation

## 2018-02-14 NOTE — Discharge Instructions (Signed)
DC to home post  VSS D/C IVF when PO intake adequate. F/U @ [facility] [facility] x 1 wk.

## 2018-02-14 NOTE — Transfer of Care (Signed)
Immediate Anesthesia Transfer of Care Note  Patient: Maria [name]  Procedure(s) Performed: EXAM UNDER ANESTHESIA (Bilateral Eye)  Patient Location: PACU  Anesthesia Type:General  Level of Consciousness: drowsy  Airway & Oxygen Therapy: Patient Spontanous Breathing  Post-op Assessment: Report given to RN and Post -op Vital signs reviewed and stable  Post vital signs: Reviewed and stable  Last Vitals:  Vitals Value Taken Time  BP 124/73 02/14/2018 11:26 AM  Temp    Pulse 70 02/14/2018 11:25 AM  Resp 17 02/14/2018 11:26 AM  SpO2 100 % 02/14/2018 11:25 AM  Vitals shown include unvalidated device data.  Last Pain:  Vitals:   02/14/18 0707  TempSrc: Oral      Patients Stated Pain Goal: 0 (14/78/29 5621)  Complications: No apparent anesthesia complications

## 2018-02-14 NOTE — Brief Op Note (Signed)
02/14/2018  11:17 AM  PATIENT:  Maria [name]  42 y.o. 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   Delay start of Pharmacological VTE agent (>24hrs) due to surgical blood loss or risk of bleeding: yes

## 2018-02-14 NOTE — Anesthesia Procedure Notes (Signed)
Procedure Name: LMA Insertion Date/Time: 02/14/2018 10:47 AM Performed by: Shirlyn Goltz, CRNA Patient Re-evaluated:Patient Re-evaluated prior to induction Oxygen Delivery Method: Circle system utilized Preoxygenation: Pre-oxygenation with 100% oxygen Induction Type: Combination inhalational/ intravenous induction Ventilation: Mask ventilation without difficulty and Oral airway inserted - appropriate to patient size LMA: LMA inserted LMA Size: 3.0 Number of attempts: 1 Placement Confirmation: positive ETCO2 and breath sounds checked- equal and bilateral Tube secured with: Tape Dental Injury: Teeth and Oropharynx as per pre-operative assessment

## 2018-02-14 NOTE — Interval H&P Note (Signed)
History and Physical Interval Note:  02/14/2018 9:54 AM  Maria [name]  has presented today for surgery, with the diagnosis of glaucoma in both eyes  The various methods of treatment have been discussed with the patient and family. After consideration of risks, benefits and other options for treatment, the patient has consented to  Procedure(s): EXAM UNDER ANESTHESIA (Bilateral) as a surgical intervention .  The patient's history has been reviewed, patient examined, no change in status, stable for surgery.  I have reviewed the patient's chart and labs.  Questions were answered to the patient's satisfaction.     Gevena Cotton

## 2018-02-14 NOTE — Op Note (Signed)
Maria, [name] MEDICAL RECORD [id number] ACCOUNT [id number] DATE OF BIRTH:[birth year] FACILITY: [facility] LOCATION: [facility] PHYSICIAN:Tige Meas Enid Cutter, MD  OPERATIVE REPORT  DATE OF PROCEDURE:  02/14/2018  PREOPERATIVE DIAGNOSES:   1.  Developmental delay. 2.  Failure to thrive. 3.  Congenital glaucoma. 4.  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  PROCEDURE:  Examination under anesthesia.  SURGEON:  Gevena Cotton, MD  ANESTHESIA:  General.  INDICATIONS:  The patient is a 42 year old female with failure to thrive, congenital glaucoma, who presented for evaluation under anesthesia to assess the intraocular  status of the working left eye and the intraocular pressures of both eyes.   Informed consent was obtained prior to the procedure.  The patient was taken to the operating room.  DESCRIPTION OF PROCEDURE:  The patient was taken into the operating room and placed in the supine position.  The entire face was prepped and draped in the usual sterile fashion.  The intraocular pressures were then measured in the left eye and found to be 15.5 16.0 mm of HG with an average of 15.8.  Pressure was then checked on the right side and found to be an average of 20, 24, 18 and 16.  The gonioscopy was performed on the left eye.  It was found that the angles were open to the scleral spur times 360 degrees.  There was no cataracts.  Gonioscopy of the right eye revealed completely collapsed anterior chamber with a phimotic pupil and scleralized central cornea.  The eye was proptotic  with exotropia on Hirschberg testing.  Next, the retinoscopy was performed on the working left eye and the patient was found to be -3.0 diopters of myopia.  The fundus was then examined using an indirect ophthalmoscope and the optic nerve was found to be a cup to disk to approximately 0.45, some peripapillary hyperpigmentation and peripheral depigmentation .The Macula appeared  to be within normal limits.  The retina  itself demonstrates some geographic atrophy in the periphery, otherwise, intact.  There was no retinal detachment.  At the conclusion of the procedure.  TobraDex ointment was instilled in inferior fornices of both  eyes.  There were no apparent complications.  AN/NUANCE  D:02/14/2018 T:02/14/2018 JOB:[id number]

## 2018-02-14 NOTE — Anesthesia Postprocedure Evaluation (Signed)
Anesthesia Post Note  Patient: Maria [name]  Procedure(s) Performed: EXAM UNDER ANESTHESIA (Bilateral Eye)     Patient location during evaluation: PACU Anesthesia Type: General Level of consciousness: responds to stimulation Pain management: pain level controlled Vital Signs Assessment: post-procedure vital signs reviewed and stable Respiratory status: spontaneous breathing, nonlabored ventilation, respiratory function stable and patient connected to nasal cannula oxygen Cardiovascular status: blood pressure returned to baseline and stable Postop Assessment: no apparent nausea or vomiting Anesthetic complications: no    Last Vitals:  Vitals:   02/14/18 1155 02/14/18 1200  BP: 112/73   Pulse: 74 75  Resp: 16 13  Temp:    SpO2: 100% 100%    Last Pain:  Vitals:   02/14/18 0707  TempSrc: Oral                 [name]

## 2019-02-19 ENCOUNTER — Ambulatory Visit: Payer: Medicare Other | Admitting: Physical Therapy

## 2019-02-27 ENCOUNTER — Ambulatory Visit: Payer: Medicare Other | Admitting: Rehabilitative and Restorative Service Providers"

## 2019-02-28 ENCOUNTER — Ambulatory Visit: Payer: Medicare Other | Attending: Nurse Practitioner | Admitting: Rehabilitative and Restorative Service Providers"

## 2019-02-28 ENCOUNTER — Encounter: Payer: Self-pay | Admitting: Rehabilitative and Restorative Service Providers"

## 2019-02-28 ENCOUNTER — Other Ambulatory Visit: Payer: Self-pay

## 2019-02-28 DIAGNOSIS — R293 Abnormal posture: Secondary | ICD-10-CM

## 2019-02-28 DIAGNOSIS — R2689 Other abnormalities of gait and mobility: Secondary | ICD-10-CM

## 2019-02-28 DIAGNOSIS — R29818 Other symptoms and signs involving the nervous system: Secondary | ICD-10-CM | POA: Diagnosis present

## 2019-02-28 DIAGNOSIS — M6281 Muscle weakness (generalized): Secondary | ICD-10-CM | POA: Diagnosis present

## 2019-02-28 NOTE — Therapy (Signed)
[facility] [address] [place], [place], [address] Phone: [phone]   Fax:  [phone]  Physical Therapy Evaluation  Patient Details  Name: Maria [name] MRN: [id number] Date of Birth: 22-Sep-1975 Referring Provider (PT): Tobie Lords, FNP   Encounter Date: 02/28/2019  PT End of Session - 02/28/19 0826    Visit Number  1    Number of Visits  13   eval + 12 visits   Date for PT Re-Evaluation  04/29/19    Authorization Type  medicare/ medicaid 10th visit note    PT Start Time  0708    PT Stop Time  0748    PT Time Calculation (min)  40 min    Activity Tolerance  Patient tolerated treatment well    Behavior During Therapy  --   Patient unable to follow commands, but able to participate with activities with prompting from mother and PT.      Past Medical History:  Diagnosis Date  . Bowel incontinence   . CP (cerebral palsy) ([place])   . Glaucoma   . Hyperlipemia   . Mental retardation   . Muscular dystrophy ([place])   . Muscular dystrophy ([place])   . Pneumonia    x 3  . Seizures ([place])    6 years none since 1987- no longer on medication , no longer sees a neurologist-   . Sepsis ([place])   . Urine incontinence     Past Surgical History:  Procedure Laterality Date  . CLEFT PALATE REPAIR    . EYE SURGERY Bilateral    Glucoma -   . NASAL SEPTUM SURGERY     lifted up    There were no vitals filed for this visit.   Subjective Assessment - 02/28/19 0708    Subjective  The patient has a gait trainer at home and her mother reports she would need therapy to learn to use the walker.  She wants the harness and the treadmill (available at our site) to help with mobility.  The patient used to use a gait training walker at home and has not walked in the last 8 years.  The patient's mother reports the patient's diagnosis is unknown.  She has glaucoma, cleft palette.  Mother notes brain imaging is normal, however she has  mild to severe MR.  Mom reports "she understands a lot."  The patient has AFOs present.    Patient is accompained by:  Family member   Maria [name], mother   Pertinent History  Listed as muscular dystrophy, CP.    Patient Stated Goals  Mother notes the goal is to walk with the gait training walker.    Currently in Pain?  No/denies   Mom notes she is not usually in pain, but she can read pain signals via non-verbals and crying.      CLINIC OPERATION CHANGES: Outpatient Neuro Rehab is open at lower capacity following universal masking, social distancing, and patient screening.  The patient's COVID risk of complications score is 0.   [facility] PT Assessment - 02/28/19 0716      Assessment   Medical Diagnosis  Muscular Dystrophy, CP, MR, renal disorder    Referring Provider (PT)  Tobie Lords, FNP    Onset Date/Surgical Date  01/17/19   congenital deficits   Prior Therapy  None recently.      Precautions   Precautions  Other (comment)    Precaution Comments  Patient needs full assist for mobility  Required Braces or Orthoses  Other Brace/Splint    Other Brace/Splint  has AFOs, has a scoliosis "body brace".      Restrictions   Weight Bearing Restrictions  No    Other Position/Activity Restrictions  Patient has been standing per mom with therapy in [place] and stands intermittently at home.       Home Environment   Living Environment  Private residence    Living Arrangements  Parent   and older brother   Type of [place] Access  Level entry    Home Layout  One level    Home Equipment  Transport chair   gait trainer/ stroller chair, hoyer lift, Nyriah crawls     Prior Function   Level of Independence  Needs assistance with ADLs;Needs assistance with transfers   Needs 24 hour supervision/assist.     Posture/Postural Control   Posture/Postural Control  Postural limitations    Posture Comments  Patient has scoliosis.  She has bony deformities with small bone structure  (lower extremities reduced in size, as well as distal UEs).      Tone   Assessment Location  Right Lower Extremity;Left Lower Extremity      ROM / Strength   AROM / PROM / Strength  AROM      AROM   Overall AROM Comments  *AROM hard to assess due to inability to follow commands.  Bilateral shoulder PROM is WFLs, bilateral hip PROM is WFLs, bilateral hip rotation PROM WFLs.  The patient maintains an everted bilateral ankle position.  With PROM into dorsiflexion, she has some resistance in gastrocs.  She compensates by using ankle eversion to accomplish full motion.  She has ankle instability noted in eversion and inversion.  She has DAFOs ([place]) that provide stability for weight bearing.      Flexibility   Soft Tissue Assessment /Muscle Length  yes    Hamstrings  Mild tightness in bilateral hamstrings.  In standing, note significant hip flexor shortening.        Bed Mobility   Bed Mobility  Rolling Right;Rolling Left    Rolling Right  Supervision/verbal cueing    Rolling Left  Supervision/Verbal cueing   Patient needs prompting and mother's encouragement to roll.     Transfers   Transfers  Sit to Stand;Stand to Sit    Sit to Stand  2: Max assist    Sit to Stand Details (indicate cue type and reason)  Due to dump in stroller w/c seat, patient needed full assist to come to edge of chair.  Sit<>stand max assist at parallel bar with bilateral UE support.  In standing she maintains knee flexion, hip flexion and heels raise off of the floor (AFOs donned).  Patient leans anteriorly with trunk.  Patient can stand with mod A.    Stand to Sit  2: Max assist    Comments  Dependent lift from stroller w/c to therapy mat.       Balance   Balance Assessed  Yes      Static Sitting Balance   Static Sitting - Balance Support  No upper extremity supported    Static Sitting - Level of Assistance  6: Modified independent (Device/Increase time)    Static Sitting - Comment/# of Minutes  PT supervised,  however mom reports patient sits and crawls as means of mobility in the home environment.      Dynamic Sitting Balance   Dynamic Sitting - Balance Support  No upper extremity supported    Dynamic Sitting balance - Comments  Patient is able to perform minimal reaching in seated position (using heel sitting and circle sitting position).      RLE Tone   RLE Tone  Hypotonic      LLE Tone   LLE Tone  Hypotonic                Objective measurements completed on examination: See above findings.              PT Education - 02/28/19 0824    Education Details  Discussed goals of therapy and PT plan of care.    Person(s) Educated  Parent(s)    Methods  Explanation    Comprehension  Verbalized understanding       PT Short Term Goals - 02/28/19 0833      PT SHORT TERM GOAL #1   Title  The patient's mother will be able to return demo HEP for LE stretching, standing tolerance.    Time  4    Period  Weeks    Target Date  03/30/19      PT SHORT TERM GOAL #2   Title  The patient will tolerate standing with assist from family for 2 minutes with UE support and mod A.    Time  4    Period  Weeks    Target Date  03/30/19      PT SHORT TERM GOAL #3   Title  The patient will be able to pull up onto 16-20" from the floor to stand with mod A for functional standing/transfers.    Time  4    Target Date  03/30/19        PT Long Term Goals - 02/28/19 4315      PT LONG TERM GOAL #1   Title  The patient's mother will subjectively report increased ease with donning AFOs.    Time  8    Target Date  04/29/19      PT LONG TERM GOAL #2   Title  The patient will tolerate standing x 5 minutes while engaged in activities to demo improved standing tolerance.    Time  8    Target Date  04/29/19      PT LONG TERM GOAL #3   Title  The patient's mother will return demo progression of HEP for post d/c.    Time  8    Target Date  04/29/19      PT LONG TERM GOAL #4   Title  The  patient will be able to take 10+ steps with appropriate assistive device with min to mod A for improved upright mobility.    Time  8    Period  Weeks    Target Date  04/29/19             Plan - 02/28/19 0840    Clinical Impression Statement  The patient is a 43 year old female presenting to outpatient physical therapy with her mother.  She has congenital deficits of hypotonia, muscle weakness, ankle instability, postural scoliosis, and developmental delay.  The patient's mother states primary goal is to return to ambulating short distances in the home (it has been 8+years since this occurred).  PT to address muscle shortening, transfers and standing tolerance to work towards this goal.    Personal Factors and Comorbidities  Behavior Pattern;Other;Comorbidity 1;Comorbidity 2;Time since onset of injury/illness/exacerbation   Patient is nonverbal and unable  to follow commands.   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    Examination-Activity Limitations  Locomotion Level;Transfers;Stand;Sit    Examination-Participation Restrictions  Other   Patient dependent for participation in life roles   Stability/Clinical Decision Making  Evolving/Moderate complexity    Clinical Decision Making  Moderate    Rehab Potential  Good    PT Frequency  --   eval + 2x/week 4 week + 1x/week for 4 weeks   PT Duration  8 weeks    PT Treatment/Interventions  ADLs/Self Care Home Management;Neuromuscular re-education;Gait training;DME Instruction;Therapeutic exercise;Balance training;Therapeutic activities;Functional mobility training;Manual techniques;Passive range of motion;Patient/family education;Orthotic Fit/Training    PT Next Visit Plan  Establish HEP for hip flexor stretching, work on donning AFOs with parent, standing tolerance/ begin standing program if able, transfers from floor<>standing near wooden step with UE weight bearing.    Consulted  and Agree with Plan of Care  Patient;Family member/caregiver    Family Member Consulted  Maria [name], mother       Patient will benefit from skilled therapeutic intervention in order to improve the following deficits and impairments:  Abnormal gait, Hypomobility, Decreased balance, Decreased mobility, Decreased range of motion, Impaired flexibility, Postural dysfunction, Difficulty walking, Decreased strength, Impaired tone  Visit Diagnosis: Abnormal posture  Muscle weakness (generalized)  Other abnormalities of gait and mobility  Other symptoms and signs involving the nervous system     Problem List Patient Active Problem List   Diagnosis Date Noted  . Rash 10/09/2017  . Cerebral palsy ([place]) 10/09/2017  . Bacteria in urine 10/09/2017  . Lactic acidosis 10/09/2017  . Pneumonia 10/07/2017  . Respiratory failure, acute ([place]) 08/28/2015  . Pyrexia   . Difficult intravenous access   . Sepsis ([place]) 08/27/2015  . Mental retardation 08/27/2015  . CAP (community acquired pneumonia) 08/27/2015    Gage, PT 02/28/2019, 8:56 AM  [facility] [address] [place], [place], [address] Phone: [phone]   Fax:  [phone]  Name: Rasheda Ledger MRN: [id number] Date of Birth: 12-May-1976

## 2019-08-22 ENCOUNTER — Ambulatory Visit: Payer: Medicare Other | Admitting: Physical Therapy

## 2019-08-26 ENCOUNTER — Ambulatory Visit: Payer: Medicare Other | Attending: Nurse Practitioner | Admitting: Physical Therapy

## 2019-08-26 ENCOUNTER — Other Ambulatory Visit: Payer: Self-pay

## 2019-08-26 DIAGNOSIS — R2689 Other abnormalities of gait and mobility: Secondary | ICD-10-CM

## 2019-08-26 DIAGNOSIS — M6281 Muscle weakness (generalized): Secondary | ICD-10-CM | POA: Diagnosis present

## 2019-08-26 DIAGNOSIS — R293 Abnormal posture: Secondary | ICD-10-CM | POA: Diagnosis present

## 2019-08-26 DIAGNOSIS — R29818 Other symptoms and signs involving the nervous system: Secondary | ICD-10-CM | POA: Diagnosis present

## 2019-08-27 NOTE — Therapy (Signed)
[facility] [address] [place], [place], [address] Phone: [phone]   Fax:  [phone]  Physical Therapy Evaluation  Patient Details  Name: Maria [name] MRN: [id number] Date of Birth: [birth year] Referring Provider (PT): Tobie Lords, FNP   Encounter Date: 08/26/2019  PT End of Session - 08/27/19 1757    Visit Number  1    Number of Visits  9    Date for PT Re-Evaluation  11/25/19    Authorization Type  Medicare, Medicaid    PT Start Time  1020    PT Stop Time  1055    PT Time Calculation (min)  35 min    Activity Tolerance  Patient tolerated treatment well    Behavior During Therapy  --   Pt non-verbal, follows commands with mother's prompting      Past Medical History:  Diagnosis Date  . Bowel incontinence   . CP (cerebral palsy) ([place])   . Glaucoma   . Hyperlipemia   . Mental retardation   . Muscular dystrophy ([place])   . Muscular dystrophy ([place])   . Pneumonia    x 3  . Seizures ([place])    6 years none since 1987- no longer on medication , no longer sees a neurologist-   . Sepsis ([place])   . Urine incontinence     Past Surgical History:  Procedure Laterality Date  . CLEFT PALATE REPAIR    . EYE SURGERY Bilateral    Glucoma -   . NASAL SEPTUM SURGERY     lifted up    There were no vitals filed for this visit.   Subjective Assessment - 08/26/19 1028    Subjective  Mom present and provides history, as pt is nonverbal.  Mom reports she herself had a rare disease this year and wasn't able to bring pt into therapy (at June eval).  She has a new gait trainer walker (this one is heavy).  She's able to go in that backwards, but not forwards.  She typically crawls for her mobility.  She has AFOs, but not wearing them.    Patient is accompained by:  Family member    Pertinent History  Listed as muscular dystrophy, CP.    Patient Stated Goals  To use her walker and strengthen her legs.    Currently in  Pain?  No/denies   mom reports she will whine if in pain        [facility] PT Assessment - 08/27/19 1746      Assessment   Medical Diagnosis  Muscular Dystrophy, CP, MR, renal disorder    Referring Provider (PT)  Tobie Lords, FNP    Onset Date/Surgical Date  08/23/19    Prior Therapy  None recently-was evaluated June 2020, but could not come to appointments due to mom's illness      Precautions   Precautions  Other (comment)    Required Braces or Orthoses  Other Brace/Splint    Other Brace/Splint  has AFOs, has a scoliosis "body brace".      Restrictions   Weight Bearing Restrictions  No    Other Position/Activity Restrictions  Per mom:  pt able to independently transition chair<>floor and bed; crawl at home independently      Balance Screen   Has the patient fallen in the past 6 months  No    Has the patient had a decrease in activity level because of a fear of falling?   No  Is the patient reluctant to leave their home because of a fear of falling?   No      Home Social worker  Private residence    Living Arrangements  Parent   Daughter and son live nearby   Available Help at Discharge  Family    Type of [place] Access  Level entry    [place]  --   Administrator, arts, Transport chair, hoyer lift     Prior Function   Level of Independence  Needs assistance with ADLs;Needs assistance with transfers   Needs 24 hr supervision, assist   Leisure  Per mom-enjoys music      Cognition   Overall Cognitive Status  History of cognitive impairments - at baseline      Posture/Postural Control   Posture/Postural Control  Postural limitations    Posture Comments  Patient has scoliosis.  She has bony deformities with small bone structure (lower extremities reduced in size, as well as distal UEs).      Tone   Assessment Location  Right Lower Extremity;Left Lower Extremity      ROM / Strength   AROM / PROM / Strength   PROM      PROM   Overall PROM   Deficits    Overall PROM Comments  PROM of hamstrings is limited in supine positioning; difficult to fully assess, but near 30 degrees lacking full knee extension      Bed Mobility   Bed Mobility  Rolling Right;Rolling Left    Rolling Right  Supervision/verbal cueing    Rolling Left  Supervision/Verbal cueing   Able to perform bed mobility with mom's prompting     Transfers   Transfers  Sit to Stand;Stand to Sit    Sit to Stand  2: Max assist    Sit to Stand Details (indicate cue type and reason)  Sit to stand from edge of mat, with PT cues at hips/gluts for more upright standing position.    Stand to Sit  2: Max assist    Comments  Mom performs dependent lift transfer w/c <>mat.  When pt comes to stand, she stands in crouched position, with hip.knee flexion.  She is able to transfer to quadruped with min guard and then to tall kneeling position on mat with max assistance (brief UE support at bolster)      Ambulation/Gait   Ambulation/Gait  Yes    Ambulation/Gait Assistance  2: Max assist    Ambulation/Gait Assistance Details  With HHA, pt takes 5-6 steps mat>wheelchair    Assistive device  1 person hand held assist    Gait Pattern  Step-to pattern;Decreased step length - right;Decreased step length - left;Right flexed knee in stance;Left flexed knee in stance;Right foot flat;Left foot flat   Crouched gait pattern     Static Sitting Balance   Static Sitting - Balance Support  No upper extremity supported    Static Sitting - Level of Assistance  6: Modified independent (Device/Increase time)    Static Sitting - Comment/# of Minutes  PT supervision provided for safety      Dynamic Sitting Balance   Dynamic Sitting - Balance Support  No upper extremity supported    Dynamic Sitting balance - Comments  Pt able to change into circle sit>quadruped with mom's cues, min guard assist      RLE Tone   RLE Tone  Hypotonic  LLE Tone   LLE Tone  Hypotonic                 Objective measurements completed on examination: See above findings.              PT Education - 08/27/19 1756    Education Details  Discussed POC and goals for PT    Person(s) Educated  Patient;Parent(s)   Mom   Methods  Explanation    Comprehension  Verbalized understanding       PT Short Term Goals - 08/27/19 1804      PT SHORT TERM GOAL #1   Title  The patient's mother will be able to return demo HEP for LE stretching, standing tolerance.  TARGET 4 weeks (may be delayed from eval, due to delay in scheduling with holiday)    Time  4    Period  Weeks    Status  New      PT SHORT TERM GOAL #2   Title  The patient will tolerate standing with assist from family for 2 minutes with UE support and mod A.    Time  4    Period  Weeks    Status  New      PT SHORT TERM GOAL #3   Title  Patient will transition quadruped to tall kneeling with minimal assistance to participate in hip strengthening in tall kneeling.    Time  4    Period  Weeks    Status  New        PT Long Term Goals - 08/27/19 1806      PT LONG TERM GOAL #1   Title  Mother will be independent with progression of HEP for stretching, stregnthening, standing and gait.  TARGET 8 weeks (may be delayed from eval, due to delay in scheduling with holidays)    Time  8    Period  Weeks    Status  New      PT LONG TERM GOAL #2   Title  The patient will tolerate standing x 5 minutes while engaged in activities to demo improved standing tolerance.    Time  8    Period  Weeks    Status  New      PT LONG TERM GOAL #3   Title  Pt will perform short distance gait, 10-25 ft with AFOs and posterior rollator, with mod assist, for improved household mobility.    Time  8    Period  Weeks    Status  New             Plan - 08/27/19 1759    Clinical Impression Statement  Pt is a 43 yo female presenting to OP PT with her mother.  Patient was evaluated previously at this clinic in June  2020, but was unable to be seen further due to mother's illness.  She has congenital deficits of hypotonia, muscle weakness, ankle instability, postural scoliosis, and developmental delay, with diagnosis of muscular dystrophy.  Currently, pt crawls for primary means of mobility, and mother's goal is to help pt return to walking short distance in the home.  Pt will beneift from skilled PT to address muscle weakness, decreased flexibility, transfers and standing tolerance to progress towards gait.    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    Examination-Activity Limitations  Locomotion Level;Transfers;Stand;Sit    Examination-Participation Restrictions  Other   Pt dependent   Stability/Clinical Decision Making  Evolving/Moderate complexity    Clinical Decision Making  Moderate    Rehab Potential  Good   mother motivated to assist with carryover from therapy   PT Frequency  1x / week    PT Duration  8 weeks   plus eval   PT Treatment/Interventions  ADLs/Self Care Home Management;Neuromuscular re-education;Gait training;DME Instruction;Therapeutic exercise;Balance training;Therapeutic activities;Functional mobility training;Manual techniques;Passive range of motion;Patient/family education;Orthotic Fit/Training    PT Next Visit Plan  Establish HEP to address hip flexor and hamstring stretching, quadruped and tall kneeling exercises; check on donning AFOs, standing tolerance and gait (Mom instructed to bring in pt's rollator walker)    Consulted and Agree with Plan of Care  Patient;Family member/caregiver    Family Member Consulted  [name], mother       Patient will benefit from skilled therapeutic intervention in order to improve the following deficits  and impairments:  Abnormal gait, Hypomobility, Decreased balance, Decreased mobility, Decreased range of motion, Impaired flexibility, Postural dysfunction, Difficulty walking, Decreased strength, Impaired tone  Visit Diagnosis: Muscle weakness (generalized)  Abnormal posture  Other symptoms and signs involving the nervous system  Other abnormalities of gait and mobility     Problem List Patient Active Problem List   Diagnosis Date Noted  . Rash 10/09/2017  . Cerebral palsy ([place]) 10/09/2017  . Bacteria in urine 10/09/2017  . Lactic acidosis 10/09/2017  . Pneumonia 10/07/2017  . Respiratory failure, acute ([place]) 08/28/2015  . Pyrexia   . Difficult intravenous access   . Sepsis ([place]) 08/27/2015  . Mental retardation 08/27/2015  . CAP (community acquired pneumonia) 08/27/2015    Durwin Davisson W. 08/27/2019, 6:10 PM  Frazier Butt., PT  [facility] [address] [place], [place], [address] Phone: [phone]   Fax:  [phone]  Name: Maria [name] MRN: [id number] Date of Birth: 09/20/1975

## 2019-09-26 ENCOUNTER — Ambulatory Visit: Payer: Medicare Other | Admitting: Physical Therapy

## 2019-10-03 ENCOUNTER — Other Ambulatory Visit: Payer: Self-pay

## 2019-10-03 ENCOUNTER — Ambulatory Visit: Payer: Medicare Other | Attending: Nurse Practitioner | Admitting: Physical Therapy

## 2019-10-03 DIAGNOSIS — R293 Abnormal posture: Secondary | ICD-10-CM | POA: Insufficient documentation

## 2019-10-03 DIAGNOSIS — M6281 Muscle weakness (generalized): Secondary | ICD-10-CM | POA: Insufficient documentation

## 2019-10-03 NOTE — Patient Instructions (Signed)
Access Code: [email or web address]  URL: [email or web address]  Date: 10/03/2019  Prepared by: Mady Haagensen   Exercises Long Sitting Hamstring Stretch - 3 minutes hold - 1-2x daily - 5x weekly

## 2019-10-03 NOTE — Therapy (Signed)
[facility] [address] [place], [place], [address] Phone: [phone]   Fax:  [phone]  Physical Therapy Treatment  Patient Details  Name: Maria [name] MRN: [id number] Date of Birth: 12-17-1975 Referring Provider (PT): Tobie Lords, FNP   Encounter Date: 10/03/2019  PT End of Session - 10/03/19 1910    Visit Number  2    Number of Visits  9    Date for PT Re-Evaluation  11/25/19    Authorization Type  Medicare, Medicaid    PT Start Time  1538   Pt arrives late   PT Stop Time  1616    PT Time Calculation (min)  38 min    Equipment Utilized During Treatment  Gait belt   wearing pt's bilatera AFOs (donned/doffed during session)   Activity Tolerance  Patient tolerated treatment well   redirection multiple times during therapy session   Behavior During Therapy  --   Pt non-verbal, follows commands with mother's prompting      Past Medical History:  Diagnosis Date  . Bowel incontinence   . CP (cerebral palsy) ([place])   . Glaucoma   . Hyperlipemia   . Mental retardation   . Muscular dystrophy ([place])   . Muscular dystrophy ([place])   . Pneumonia    x 3  . Seizures ([place])    6 years none since 1987- no longer on medication , no longer sees a neurologist-   . Sepsis ([place])   . Urine incontinence     Past Surgical History:  Procedure Laterality Date  . CLEFT PALATE REPAIR    . EYE SURGERY Bilateral    Glucoma -   . NASAL SEPTUM SURGERY     lifted up    There were no vitals filed for this visit.  Subjective Assessment - 10/03/19 1908    Subjective  Mom brings in AFOs today, states they are bigger than her previous ones, making shoes much bigger and heavier.    Patient is accompained by:  Family member    Pertinent History  Listed as muscular dystrophy, CP.    Patient Stated Goals  To use her walker and strengthen her legs.    Currently in Pain?  No/denies   No overt c/o pain         Therapeutic  Activity: PT/mom don bilateral AFOs at beginning of PT session.     -Mom reports pt does not like new AFOS (since they have been in , approx 45 year old), as the shoes are 3 sizes too big and heavy for pt.  PT advised mom to  contact orthotist ([facility]) to discuss fitting (as R foot has excess room in AFO, L great toe extends past AFO)  Attempted sit>stand x 3 reps from clinic w/c to stand at edge of 16" mat in Room 1.  Pt requires max/total assist of therapist, with tactile/manual cues at buttocks to help with more upright standing.  Seated edge of mat, with red therapy ball in front of pt:  PT places hands on therapy ball, with hand over hand cues for pressing/bouncing BUEs through therapy ball with slight forward lean at edge of mat.  Pt takes L hand off of ball multiple times, with PT/mom replacing hand and providing cues; working on trunk control and posture.  Attempted to assist pt move therapy ball laterally/forward/back while sitting edge of mat, pt continues to remove hands from ball.  With therapist seated on therapy ball in front  of pt sitting on 16" mat, attempted again sit>stand x 3 reps, with tactile/manual cues through shoulder girdle, at hips, with pt partially standing in crouched position x 1, for up to 30 seconds.  Seated on mat, in long sitting position (therapist provides total assist for transition to long sit):  PT provides cues to stay in long sit position x approx 3 minutes, to avoid external rotation of BLEs.  PT provides cues to change positions to circle sit, then to one leg extended, other leg in hip/knee flexion/external rotation, x 30 seconds each position.  Total assist transfer mat>w/c, with pt able to scoot into position once in  w/c.                      PT Education - 10/03/19 1909    Education Details  Additions of long sitting position to pt's sitting routine, with assistance-see HEP details from [facility]    Person(s) Educated   Patient;Parent(s)   mom   Methods  Explanation;Demonstration;Handout    Comprehension  Verbalized understanding;Returned demonstration   Mom verbalizes understanding while PT/pt performing activity      PT Short Term Goals - 10/03/19 1921      PT SHORT TERM GOAL #1   Title  The patient's mother will be able to return demo HEP for LE stretching, standing tolerance.  TARGET 4 weeks (may be delayed from eval, due to delay in scheduling with holiday):10/25/2019    Time  4    Period  Weeks    Status  New    Target Date  10/25/19      PT SHORT TERM GOAL #2   Title  The patient will tolerate standing with assist from family for 2 minutes with UE support and mod A.    Time  4    Period  Weeks    Status  New    Target Date  10/25/19      PT SHORT TERM GOAL #3   Title  Patient will transition quadruped to tall kneeling with minimal assistance to participate in hip strengthening in tall kneeling.    Time  4    Period  Weeks    Status  New    Target Date  10/25/19        PT Long Term Goals - 08/27/19 1806      PT LONG TERM GOAL #1   Title  Mother will be independent with progression of HEP for stretching, stregnthening, standing and gait.  TARGET 8 weeks (may be delayed from eval, due to delay in scheduling with holidays)    Time  8    Period  Weeks    Status  New      PT LONG TERM GOAL #2   Title  The patient will tolerate standing x 5 minutes while engaged in activities to demo improved standing tolerance.    Time  8    Period  Weeks    Status  New      PT LONG TERM GOAL #3   Title  Pt will perform short distance gait, 10-25 ft with AFOs and posterior rollator, with mod assist, for improved household mobility.    Time  8    Period  Weeks    Status  New            Plan - 10/03/19 1916    Clinical Impression Statement  Skilled PT session focused today on attempts at supported standing, as well  as sitting balance exercises for core strengthening and hamstring  stretching.  Pt needs frequent/constant redirection and assistance throughout course of therapy session.  Pt's mom brought in AFOs, which were used during therapy session; however, did not bring in pt's (?posterior rollator) walker.  Due to pt's stature (45 inches tall and 57 pounds), PT explained that equipment in our gym is not small enough to safely accommodate pt for standing activities; therefore, PT requested again for mom to try to bring in pt's walker for standing/gait activities.  Did initiate HEP with long sitting exercise to stretch hamstrings.  Pt will continue to benefit from skilled PT to address muscle weakness, decreased flexibility, transfers and standing tolerance.    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    Examination-Activity Limitations  Locomotion Level;Transfers;Stand;Sit    Examination-Participation Restrictions  Other   Pt dependent   Stability/Clinical Decision Making  Evolving/Moderate complexity    Rehab Potential  Good   mother motivated to assist with carryover from therapy   PT Frequency  1x / week    PT Duration  8 weeks   plus eval   PT Treatment/Interventions  ADLs/Self Care Home Management;Neuromuscular re-education;Gait training;DME Instruction;Therapeutic exercise;Balance training;Therapeutic activities;Functional mobility training;Manual techniques;Passive range of motion;Patient/family education;Orthotic Fit/Training    PT Next Visit Plan  Review long sit positions (have they done this at home?); try supine (with legs over ball for bridging, hip strengthening?) to add to HEP.  Try standing tolerance at elevate mat surface or pt's walker (if they bring in).  Quadruped/crawling/tall kneeling    Consulted and Agree  with Plan of Care  Patient;Family member/caregiver    Family Member Consulted  [name], mother       Patient will benefit from skilled therapeutic intervention in order to improve the following deficits and impairments:  Abnormal gait, Hypomobility, Decreased balance, Decreased mobility, Decreased range of motion, Impaired flexibility, Postural dysfunction, Difficulty walking, Decreased strength, Impaired tone  Visit Diagnosis: Muscle weakness (generalized)  Abnormal posture     Problem List Patient Active Problem List   Diagnosis Date Noted  . Rash 10/09/2017  . Cerebral palsy ([place]) 10/09/2017  . Bacteria in urine 10/09/2017  . Lactic acidosis 10/09/2017  . Pneumonia 10/07/2017  . Respiratory failure, acute ([place]) 08/28/2015  . Pyrexia   . Difficult intravenous access   . Sepsis ([place]) 08/27/2015  . Mental retardation 08/27/2015  . CAP (community acquired pneumonia) 08/27/2015    Zoei Amison W. 10/03/2019, 7:23 PM  Mady Haagensen, PT 10/03/19 7:33 PM Phone: [phone] Fax: [facility] [address] [place], [place], [address] Phone: [phone]   Fax:  [phone]  Name: Maria [name] MRN: [id number] Date of Birth: 02-Feb-1976

## 2019-10-10 ENCOUNTER — Ambulatory Visit: Payer: Medicare Other | Admitting: Physical Therapy

## 2019-10-17 ENCOUNTER — Ambulatory Visit: Payer: Medicare Other | Admitting: Physical Therapy

## 2019-10-24 ENCOUNTER — Ambulatory Visit: Payer: Medicare Other | Admitting: Physical Therapy

## 2019-10-31 ENCOUNTER — Other Ambulatory Visit: Payer: Self-pay

## 2019-10-31 ENCOUNTER — Ambulatory Visit: Payer: Medicare Other | Attending: Nurse Practitioner

## 2019-10-31 DIAGNOSIS — R293 Abnormal posture: Secondary | ICD-10-CM

## 2019-10-31 DIAGNOSIS — M6281 Muscle weakness (generalized): Secondary | ICD-10-CM | POA: Diagnosis present

## 2019-10-31 NOTE — Therapy (Signed)
[facility] [address] [place], [place], [address] Phone: [phone]   Fax:  [phone]  Physical Therapy Treatment  Patient Details  Name: Maria [name] MRN: [id number] Date of Birth: 1975/11/15 Referring Provider (PT): Tobie Lords, FNP   Encounter Date: 10/31/2019  PT End of Session - 10/31/19 1618    Visit Number  3    Number of Visits  9    Date for PT Re-Evaluation  11/25/19    Authorization Type  Medicare, Medicaid    PT Start Time  1615    PT Stop Time  1655    PT Time Calculation (min)  40 min    Equipment Utilized During Treatment  Gait belt   wearing pt's bilatera AFOs (donned/doffed during session)   Activity Tolerance  Patient tolerated treatment well   redirection multiple times during therapy session   Behavior During Therapy  --   Pt non-verbal, follows commands with mother's prompting      Past Medical History:  Diagnosis Date  . Bowel incontinence   . CP (cerebral palsy) ([place])   . Glaucoma   . Hyperlipemia   . Mental retardation   . Muscular dystrophy ([place])   . Muscular dystrophy ([place])   . Pneumonia    x 3  . Seizures ([place])    6 years none since 1987- no longer on medication , no longer sees a neurologist-   . Sepsis ([place])   . Urine incontinence     Past Surgical History:  Procedure Laterality Date  . CLEFT PALATE REPAIR    . EYE SURGERY Bilateral    Glucoma -   . NASAL SEPTUM SURGERY     lifted up    There were no vitals filed for this visit.  Subjective Assessment - 10/31/19 1615    Subjective  Pt's mom reports that she has been pulling up to stand at home some. She has not been wearing the braces as too heavy for her. Mom has not called [employer] about the braces. No walker brought in.    Patient is accompained by:  Family member    Pertinent History  Listed as muscular dystrophy, CP.    Patient Stated Goals  To use her walker and strengthen her legs.    Currently in  Pain?  No/denies   no signs of pain noted                      [facility] Adult PT Treatment/Exercise - 10/31/19 1705      Transfers   Transfers  Lateral/Scoot Transfers;Sit to Stand;Stand to Sit;Squat Pivot Transfers    Sit to Stand  1: +1 Total assist    Sit to Stand Details (indicate cue type and reason)  Pt performed sit to stand from 12" box with pad on top x 3 with bilateral cascade AFOs donned with cuing to try to pull up on therapist arm but patient was total assist and not putting any weight through legs. Tried to encourage by having her reach for something but patient did not reach for any objects offered. Stood only a couple seconds total assist of therapist with blocking knees as well.     Stand to Sit  1: +1 Total assist    Squat Pivot Transfers  1: +1 Total assist    Squat Pivot Transfer Details (indicate cue type and reason)  box to mat and mat to stroller with pt holding behind therapist shoulders  Lateral/Scoot Transfers  3: Mod assist    Lateral/Scoot Transfer Details (indicate cue type and reason)  Pt crawled out of stroller to mat on stomach with max encouragement from mom.      Therapeutic Activites    Therapeutic Activities  Other Therapeutic Activities    Other Therapeutic Activities  Tall kneeling on mat for brief periods x 3 with min assist to rise and max encouragement from mother. Pt maintained tall kneeling with min assist and then attempted to have her hold red physioball but she did not want to hold to ball.  Sitting on 12" box with pad on top having patient lean forward with PT guiding with holding hands and then returning to upright x 5 for core activation. Sitting edge of mat without feet support with having pt try to maintain upright sitting with hands in lap with PT providing some manual pertubations on varied directions.       Exercises   Exercises  Other Exercises    Other Exercises   Sitting in long sit with PT assisting to keep right leg in  position for hamstring stretch x 2 min.              PT Education - 10/31/19 1851    Education Details  Pt to continue with current HEP. Mom to contact [employer] about AFO needing adjustment.    Person(s) Educated  Patient;Parent(s)   mom   Methods  Explanation    Comprehension  Verbalized understanding       PT Short Term Goals - 10/31/19 1851      PT SHORT TERM GOAL #1   Title  The patient's mother will be able to return demo HEP for LE stretching, standing tolerance.  TARGET 4 weeks (may be delayed from eval, due to delay in scheduling with holiday):10/25/2019    Baseline  Pt's mother reports that she has been long sitting daily. States she will pull up to stand at times on her own but unable to get her to in clinic 10/30/19    Time  4    Period  Weeks    Status  On-going    Target Date  10/25/19      PT SHORT TERM GOAL #2   Title  The patient will tolerate standing with assist from family for 2 minutes with UE support and mod A.    Baseline  total assist to stand on 10/30/19    Time  4    Period  Weeks    Status  Not Met    Target Date  10/25/19      PT SHORT TERM GOAL #3   Title  Patient will transition quadruped to tall kneeling with minimal assistance to participate in hip strengthening in tall kneeling.    Time  4    Period  Weeks    Status  Achieved    Target Date  10/25/19        PT Long Term Goals - 08/27/19 1806      PT LONG TERM GOAL #1   Title  Mother will be independent with progression of HEP for stretching, stregnthening, standing and gait.  TARGET 8 weeks (may be delayed from eval, due to delay in scheduling with holidays)    Time  8    Period  Weeks    Status  New      PT LONG TERM GOAL #2   Title  The patient will tolerate standing x 5 minutes while  engaged in activities to demo improved standing tolerance.    Time  8    Period  Weeks    Status  New      PT LONG TERM GOAL #3   Title  Pt will perform short distance gait, 10-25 ft with AFOs  and posterior rollator, with mod assist, for improved household mobility.    Time  8    Period  Weeks    Status  New            Plan - 10/31/19 1856    Clinical Impression Statement  Pt was able to get in to long sit position with  min assist to keep right leg in proper position as did want to ER. Mom reporting that they have not been using AFOs as pt too heavy and patient does not like them with poor fit. Did bring to session and PT donned with standing attempts. Mother did not bring gait trainer walker and no equipment in clinic appropriate to try with patient due to small stature. Pt with limited participation throughout session.    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    Examination-Activity Limitations  Locomotion Level;Transfers;Stand;Sit    Examination-Participation Restrictions  Other   Pt dependent   Stability/Clinical Decision Making  Evolving/Moderate complexity    Rehab Potential  Good   mother motivated to assist with carryover from therapy   PT Frequency  1x / week    PT Duration  8 weeks   plus eval   PT Treatment/Interventions  ADLs/Self Care Home Management;Neuromuscular re-education;Gait training;DME Instruction;Therapeutic exercise;Balance training;Therapeutic activities;Functional mobility training;Manual techniques;Passive range of motion;Patient/family education;Orthotic Fit/Training    PT Next Visit Plan  try supine (with legs over ball for bridging, hip strengthening?) to add to HEP.  Try standing tolerance at elevate mat surface or pt's walker (if they bring in).  Quadruped/crawling/tall kneeling    Consulted and Agree with Plan of Care  Patient;Family member/caregiver    Family Member Consulted  [name], mother        Patient will benefit from skilled therapeutic intervention in order to improve the following deficits and impairments:  Abnormal gait, Hypomobility, Decreased balance, Decreased mobility, Decreased range of motion, Impaired flexibility, Postural dysfunction, Difficulty walking, Decreased strength, Impaired tone  Visit Diagnosis: Muscle weakness (generalized)  Abnormal posture     Problem List Patient Active Problem List   Diagnosis Date Noted  . Rash 10/09/2017  . Cerebral palsy ([place]) 10/09/2017  . Bacteria in urine 10/09/2017  . Lactic acidosis 10/09/2017  . Pneumonia 10/07/2017  . Respiratory failure, acute ([place]) 08/28/2015  . Pyrexia   . Difficult intravenous access   . Sepsis ([place]) 08/27/2015  . Mental retardation 08/27/2015  . CAP (community acquired pneumonia) 08/27/2015    Electa Sniff, PT, DPT, NCS 10/31/2019, 7:01 PM  [facility] [address] [place], [place], [address] Phone: [phone]   Fax:  [phone]  Name: Maria [name] MRN: [id number] Date of Birth: 1976/04/10

## 2019-11-07 ENCOUNTER — Ambulatory Visit: Payer: Medicare Other

## 2019-11-21 ENCOUNTER — Other Ambulatory Visit: Payer: Self-pay

## 2019-11-21 ENCOUNTER — Ambulatory Visit: Payer: Medicare Other | Attending: Nurse Practitioner

## 2019-11-21 DIAGNOSIS — R293 Abnormal posture: Secondary | ICD-10-CM | POA: Diagnosis present

## 2019-11-21 DIAGNOSIS — M6281 Muscle weakness (generalized): Secondary | ICD-10-CM | POA: Diagnosis present

## 2019-11-21 DIAGNOSIS — R2689 Other abnormalities of gait and mobility: Secondary | ICD-10-CM | POA: Insufficient documentation

## 2019-11-21 NOTE — Therapy (Signed)
[facility] [address] [place], [place], [address] Phone: [phone]   Fax:  [phone]  Physical Therapy Treatment  Patient Details  Name: Maria [name] MRN: [id number] Date of Birth: 1976/05/11 Referring Provider (PT): Tobie Lords, FNP   Encounter Date: 11/21/2019  PT End of Session - 11/21/19 1624    Visit Number  4    Number of Visits  9    Date for PT Re-Evaluation  11/25/19    Authorization Type  Medicare, Medicaid    PT Start Time  1620    PT Stop Time  9449    PT Time Calculation (min)  33 min    Equipment Utilized During Treatment  Gait belt   wearing pt's bilatera AFOs (donned/doffed during session)   Activity Tolerance  Patient tolerated treatment well   redirection multiple times during therapy session   Behavior During Therapy  --   Pt non-verbal, follows commands with mother's prompting      Past Medical History:  Diagnosis Date  . Bowel incontinence   . CP (cerebral palsy) ([place])   . Glaucoma   . Hyperlipemia   . Mental retardation   . Muscular dystrophy ([place])   . Muscular dystrophy ([place])   . Pneumonia    x 3  . Seizures ([place])    6 years none since 1987- no longer on medication , no longer sees a neurologist-   . Sepsis ([place])   . Urine incontinence     Past Surgical History:  Procedure Laterality Date  . CLEFT PALATE REPAIR    . EYE SURGERY Bilateral    Glucoma -   . NASAL SEPTUM SURGERY     lifted up    There were no vitals filed for this visit.  Subjective Assessment - 11/21/19 1623    Subjective  Pt's mom reports that she is still crawling at home. Mom has called [employer] about brace and has to call them back for appointment hopefully Monday.    Patient is accompained by:  Family member    Pertinent History  Listed as muscular dystrophy, CP.    Patient Stated Goals  To use her walker and strengthen her legs.    Currently in Pain?  No/denies                        [facility] Adult PT Treatment/Exercise - 11/21/19 1624      Transfers   Transfers  Sit to Stand;Stand to [employer] Transfers    Sit to Stand  3: Mod assist    Sit to Stand Details (indicate cue type and reason)  Max encouragement and guiding from mom.    Stand to Sit  3: Mod assist    Stand Pivot Transfers  3: Mod assist    Stand Pivot Transfer Details (indicate cue type and reason)  Pt transferred stroller to mat standing with mod assist to support under arms and took 2 steps to mat as well as with return. PT stabilized at right ankle as did not have braces on. Pt did need max assist to get up on mat in in stroller as too high to sit on.      Therapeutic Activites    Therapeutic Activities  Other Therapeutic Activities    Other Therapeutic Activities  Pt performed moving from sitting edge of mat to quadruped to tall kneeling with max encouragement from mom and min assist to guide in to position to  help initiate. Pt able to prop up in to tall kneeling on mat min assist more to initiate movement and stayed there briefly. Long sit holding PTs hands cued to lean forward as PT assisted to guide forward then leaned back with PT helping to guide back. Performed x 5 times to work on core strengthening/stretching in hamstrings. Long sit with PT providing manual pertubations x 5. Pt able to maintain upright sitting. Transfer onto mat on floor mod assist to initiate movement off mat and to control stand to sit. Tall kneeling on floor mat with holding on to mat table and then transfer on to mat table with patient  "crawling" up on to table with PT stabilizing at ankles when she pulled to stand to crawl up leading with right leg.              PT Education - 11/21/19 2052    Education Details  Mom to follow-up with [employer] about AFO adjustments. Discussed getting pt to spend more time in tall kneeling at edge of bed doing some activities to work on strength while  taking ankles out of it.    Person(s) Educated  Patient;Parent(s)    Methods  Explanation;Demonstration    Comprehension  Verbalized understanding       PT Short Term Goals - 10/31/19 1851      PT SHORT TERM GOAL #1   Title  The patient's mother will be able to return demo HEP for LE stretching, standing tolerance.  TARGET 4 weeks (may be delayed from eval, due to delay in scheduling with holiday):10/25/2019    Baseline  Pt's mother reports that she has been long sitting daily. States she will pull up to stand at times on her own but unable to get her to in clinic 10/30/19    Time  4    Period  Weeks    Status  On-going    Target Date  10/25/19      PT SHORT TERM GOAL #2   Title  The patient will tolerate standing with assist from family for 2 minutes with UE support and mod A.    Baseline  total assist to stand on 10/30/19    Time  4    Period  Weeks    Status  Not Met    Target Date  10/25/19      PT SHORT TERM GOAL #3   Title  Patient will transition quadruped to tall kneeling with minimal assistance to participate in hip strengthening in tall kneeling.    Time  4    Period  Weeks    Status  Achieved    Target Date  10/25/19        PT Long Term Goals - 08/27/19 1806      PT LONG TERM GOAL #1   Title  Mother will be independent with progression of HEP for stretching, stregnthening, standing and gait.  TARGET 8 weeks (may be delayed from eval, due to delay in scheduling with holidays)    Time  8    Period  Weeks    Status  New      PT LONG TERM GOAL #2   Title  The patient will tolerate standing x 5 minutes while engaged in activities to demo improved standing tolerance.    Time  8    Period  Weeks    Status  New      PT LONG TERM GOAL #3   Title  Pt will  perform short distance gait, 10-25 ft with AFOs and posterior rollator, with mod assist, for improved household mobility.    Time  8    Period  Weeks    Status  New            Plan - 11/21/19 2053     Clinical Impression Statement  Pt was treatment in private room to try to limit distractions. While patient needed max encouragement from mom she did participate more in session and was able to stand and bear weight through legs with a couple steps. Was not wearing AFOs today as mother reports she hates then so did not attempt any further steps other than to transfer due to concerns for ankle stability especially on right. PT stabilized with activities on feet at ankle. Pt able to move more than she demonstrated last visit.    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    Examination-Activity Limitations  Locomotion Level;Transfers;Stand;Sit    Examination-Participation Restrictions  Other   Pt dependent   Stability/Clinical Decision Making  Evolving/Moderate complexity    Rehab Potential  Good   mother motivated to assist with carryover from therapy   PT Frequency  1x / week    PT Duration  8 weeks   plus eval   PT Treatment/Interventions  ADLs/Self Care Home Management;Neuromuscular re-education;Gait training;DME Instruction;Therapeutic exercise;Balance training;Therapeutic activities;Functional mobility training;Manual techniques;Passive range of motion;Patient/family education;Orthotic Fit/Training    PT Next Visit Plan  Try standing tolerance at elevate mat surface or pt's walker (if they bring in).  Quadruped/crawling/tall kneeling. How is tall kneeling going at home? Have they seen [employer]?    Consulted and Agree with Plan of Care  Patient;Family member/caregiver    Family Member Consulted  [name], mother       Patient will benefit from skilled therapeutic intervention in order to improve the following deficits and impairments:  Abnormal gait,  Hypomobility, Decreased balance, Decreased mobility, Decreased range of motion, Impaired flexibility, Postural dysfunction, Difficulty walking, Decreased strength, Impaired tone  Visit Diagnosis: Muscle weakness (generalized)     Problem List Patient Active Problem List   Diagnosis Date Noted  . Rash 10/09/2017  . Cerebral palsy ([place]) 10/09/2017  . Bacteria in urine 10/09/2017  . Lactic acidosis 10/09/2017  . Pneumonia 10/07/2017  . Respiratory failure, acute ([place]) 08/28/2015  . Pyrexia   . Difficult intravenous access   . Sepsis ([place]) 08/27/2015  . Mental retardation 08/27/2015  . CAP (community acquired pneumonia) 08/27/2015    Electa Sniff, PT, DPT, NCS 11/21/2019, 8:56 PM  [facility] [address] [place], [place], [address] Phone: [phone]   Fax:  [phone]  Name: Maria [name] MRN: [id number] Date of Birth: 26-Dec-1975

## 2019-12-05 ENCOUNTER — Other Ambulatory Visit: Payer: Self-pay

## 2019-12-05 ENCOUNTER — Ambulatory Visit: Payer: Medicare Other

## 2019-12-05 DIAGNOSIS — M6281 Muscle weakness (generalized): Secondary | ICD-10-CM

## 2019-12-05 DIAGNOSIS — R293 Abnormal posture: Secondary | ICD-10-CM

## 2019-12-05 DIAGNOSIS — R2689 Other abnormalities of gait and mobility: Secondary | ICD-10-CM

## 2019-12-05 NOTE — Therapy (Signed)
[facility] [address] [place], [place], [address] Phone: [phone]   Fax:  [phone]  Physical Therapy Treatment/Recert  Patient Details  Name: Maria [name] MRN: [id number]  Date of Birth: [birth year] Referring Provider (PT): Tobie Lords, FNP   Encounter Date: 12/05/2019  PT End of Session - 12/05/19 1633    Visit Number  5    Number of Visits  9    Date for PT Re-Evaluation  03/04/20    Authorization Type  Medicare, Medicaid    PT Start Time  1630   pt arrived late   PT Stop Time  1700    PT Time Calculation (min)  30 min    Equipment Utilized During Treatment  Gait belt   wearing pt's bilatera AFOs (donned/doffed during session)   Activity Tolerance  Patient tolerated treatment well   redirection multiple times during therapy session   Behavior During Therapy  --   Pt non-verbal, follows commands with mother's prompting      Past Medical History:  Diagnosis Date  . Bowel incontinence   . CP (cerebral palsy) ([place])   . Glaucoma   . Hyperlipemia   . Mental retardation   . Muscular dystrophy ([place])   . Muscular dystrophy ([place])   . Pneumonia    x 3  . Seizures ([place])    6 years none since 1987- no longer on medication , no longer sees a neurologist-   . Sepsis ([place])   . Urine incontinence     Past Surgical History:  Procedure Laterality Date  . CLEFT PALATE REPAIR    . EYE SURGERY Bilateral    Glucoma -   . NASAL SEPTUM SURGERY     lifted up    There were no vitals filed for this visit.  Subjective Assessment - 12/05/19 1632    Subjective  Pt's was late for visit. Mom reports she forgot to call about appointment with [employer].    Patient is accompained by:  Family member    Pertinent History  Listed as muscular dystrophy, CP.    Patient Stated Goals  To use her walker and strengthen her legs.    Currently in Pain?  No/denies                       [facility] Adult PT  Treatment/Exercise - 12/05/19 1633      Transfers   Transfers  Stand Pivot Transfers;Sit to Stand;Stand to Sit    Sit to Stand  3: Mod assist    Sit to Stand Details (indicate cue type and reason)  max encouragement from mom to participate as well as guiding    Stand to Sit  3: Mod assist    Stand Pivot Transfers  3: Mod assist    Stand Pivot Transfer Details (indicate cue type and reason)  Pt transferred from stroller to/from mat. PT stabilizing at ankles.    Comments  Pt stood from edge of mat stood holding to mom's hand and PT shoulder with PT stabilizing at bilateral ankles. Pt was cued to try to stand more erect. Had crouched posture in standing. Pt stood about 1-2 min in this position.  Pt then sat back on mat with PT stabilizing at ankles with feet on floor for some continued weight bearing through legs x 1 min. Pt transferred down to floor mod assist of therapist with encouragement from mom. On mat pt transitioned back to kneeling to lean on  mat and then "crawled" onto mat with encouragement. Pt was able to come from prone to sitting on her own when mother gave her incentive of being ready to leave on her own.      Exercises   Exercises  Other Exercises    Other Exercises   Long sitting for hamstring stretch with PT holding hands and helping to guide forward. Long sitting with manual pertubations in varied directions to work on core strength x 1 min.             PT Education - 12/05/19 1729    Education Details  Instruction on plan to recert but want to wait to see until AFO changes are made to be able to assess standing. Mom will cancel visit next week if have not seen [employer] and changes made to AFO and reschedule when does receive.    Person(s) Educated  Patient;Parent(s)    Methods  Explanation    Comprehension  Verbalized understanding       PT Short Term Goals - 12/05/19 1734      PT SHORT TERM GOAL #1   Title  The patient's mother will be able to return demo HEP for  LE stretching, standing tolerance.  TARGET 4 weeks (may be delayed from eval, due to delay in scheduling with holiday):10/25/2019    Baseline  Pt's mother reports that she has been long sitting daily. States she will pull up to stand at times on her own but unable to get her to in clinic 10/30/19    Time  4    Period  Weeks    Status  On-going    Target Date  12/19/19      PT SHORT TERM GOAL #2   Title  The patient will tolerate standing with assist from family for 2 minutes with UE support and mod A.    Baseline  mod assist to stand 1-2 min with max encouragement from mom and PT stabilizing ankles.    Time  4    Period  Weeks    Status  Partially Met    Target Date  10/25/19      PT SHORT TERM GOAL #3   Title  Patient will transition quadruped to tall kneeling with minimal assistance to participate in hip strengthening in tall kneeling.    Baseline  Pt is able to transfer to tall kneeling min assist when she chooses too    Time  4    Period  Weeks    Status  Achieved    Target Date  10/25/19        PT Long Term Goals - 12/05/19 1740      PT LONG TERM GOAL #1   Title  Mother will be independent with progression of HEP for stretching, stregnthening, standing and gait.  TARGET 8 weeks (may be delayed from eval, due to delay in scheduling with holidays)    Baseline  Mother reports they have not been working on tall kneeling only pt moving in to position when she chooses.    Time  8    Period  Weeks    Status  Not Met      PT LONG TERM GOAL #2   Title  The patient will tolerate standing x 5 minutes while engaged in activities to demo improved standing tolerance.    Baseline  1-2 min mod assist with PT stabilizing ankles    Time  8    Period  Weeks  Status  Not Met      PT LONG TERM GOAL #3   Title  Pt will perform short distance gait, 10-25 ft with AFOs and posterior rollator, with mod assist, for improved household mobility.    Baseline  nonambulatory currently. Unable to  attempt due to not having proper equipment and no changes have been made to AFOs as mother has not followed up with [employer] yet.    Time  8    Period  Weeks    Status  Not Met       Updated PT goals: PT Short Term Goals - 12/05/19 1800      PT SHORT TERM GOAL #1   Title  STGs=LTGs      PT Long Term Goals - 12/05/19 1801      PT LONG TERM GOAL #1   Title  Mother will be independent with progression of HEP for stretching, stregnthening, standing and gait.    Baseline  Mother reports they have not been working on tall kneeling only pt moving in to position when she chooses.    Time  4    Period  Weeks    Status  On-going    Target Date  01/04/20      PT LONG TERM GOAL #2   Title  The patient will tolerate standing x 3 minutes min assist with proper AFOs donned.    Time  4    Period  Weeks    Status  Revised    Target Date  01/04/20      PT LONG TERM GOAL #3   Title  Pt will perform short distance gait, 15f with AFOs and posterior rollator, with mod assist, for improved household mobility.    Baseline  nonambulatory currently. Unable to attempt due to not having proper equipment and no changes have been made to AFOs as mother has not followed up with [employer] yet.    Time  4    Period  Weeks    Status  On-going    Target Date  01/04/20           Plan - 12/05/19 1733    Clinical Impression Statement  Pt was reassessed at visit today. Appears to have the ability to do more but will only perform when she wants to. Was mod assist in standing today with PT stabilizing at ankles. PT was able to see patient transfer from prone to sitting and she was able to "crawl" onto mat from kneeling at edge supervision. Mom continues to report that pt can do a lot more than she shows. PT has been limited as does not have appropriate equipment to work more on standing as mom has not been able to bring pediatric posterior walker and has also not followed up with orthotist to try to get current  AFOs looked at as pt hates to wear them since they are different than her prior ones and bulky. PT will recert but did discuss with mom that she will need to wait to come back until braces have been adjusted to make standing trials safer due to lack of ankle control.    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    Examination-Activity Limitations  Locomotion Level;Transfers;Stand;Sit    Examination-Participation Restrictions  Other   Pt  dependent   Stability/Clinical Decision Making  Evolving/Moderate complexity    Rehab Potential  Good   mother motivated to assist with carryover from therapy   PT Frequency  1x / week    PT Duration  4 weeks   plus eval   PT Treatment/Interventions  ADLs/Self Care Home Management;Neuromuscular re-education;Gait training;DME Instruction;Therapeutic exercise;Balance training;Therapeutic activities;Functional mobility training;Manual techniques;Passive range of motion;Patient/family education;Orthotic Fit/Training    PT Next Visit Plan  Assess standing in bilateral AFOs after changes are made at [employer]. Plan to only need 1-2 visits to assess this further but did put plan for 4 visits in case they are needed.    Consulted and Agree with Plan of Care  Patient;Family member/caregiver    Family Member Consulted  [name], mother       Patient will benefit from skilled therapeutic intervention in order to improve the following deficits and impairments:  Abnormal gait, Hypomobility, Decreased balance, Decreased mobility, Decreased range of motion, Impaired flexibility, Postural dysfunction, Difficulty walking, Decreased strength, Impaired tone  Visit Diagnosis: Muscle weakness (generalized)  Abnormal posture  Other  abnormalities of gait and mobility     Problem List Patient Active Problem List   Diagnosis Date Noted  . Rash 10/09/2017  . Cerebral palsy ([place]) 10/09/2017  . Bacteria in urine 10/09/2017  . Lactic acidosis 10/09/2017  . Pneumonia 10/07/2017  . Respiratory failure, acute ([place]) 08/28/2015  . Pyrexia   . Difficult intravenous access   . Sepsis ([place]) 08/27/2015  . Mental retardation 08/27/2015  . CAP (community acquired pneumonia) 08/27/2015    Electa Sniff, PT, DPT, NCS 12/05/2019, 5:59 PM  [facility] [address] [place], [place], [address] Phone: [phone]   Fax:  [phone]  Name: Maria [name] MRN: [id number] Date of Birth: [birth year]

## 2019-12-12 ENCOUNTER — Ambulatory Visit: Payer: Medicare Other

## 2020-02-13 ENCOUNTER — Other Ambulatory Visit: Payer: Self-pay | Admitting: Obstetrics and Gynecology

## 2020-02-13 ENCOUNTER — Other Ambulatory Visit (HOSPITAL_COMMUNITY): Payer: Self-pay | Admitting: Obstetrics and Gynecology

## 2020-02-13 DIAGNOSIS — R102 Pelvic and perineal pain: Secondary | ICD-10-CM

## 2020-02-14 ENCOUNTER — Encounter (HOSPITAL_COMMUNITY): Payer: Self-pay

## 2020-02-14 ENCOUNTER — Emergency Department (HOSPITAL_COMMUNITY): Payer: Medicare Other

## 2020-02-14 ENCOUNTER — Emergency Department (HOSPITAL_COMMUNITY)
Admission: EM | Admit: 2020-02-14 | Discharge: 2020-02-14 | Disposition: A | Discharge status: Home / Self Care | Payer: Medicare Other | Attending: Emergency Medicine | Admitting: Emergency Medicine

## 2020-02-14 DIAGNOSIS — Z79899 Other long term (current) drug therapy: Secondary | ICD-10-CM | POA: Diagnosis not present

## 2020-02-14 DIAGNOSIS — R102 Pelvic and perineal pain: Secondary | ICD-10-CM | POA: Diagnosis present

## 2020-02-14 DIAGNOSIS — D259 Leiomyoma of uterus, unspecified: Secondary | ICD-10-CM | POA: Insufficient documentation

## 2020-02-14 DIAGNOSIS — G7100 Muscular dystrophy, unspecified: Secondary | ICD-10-CM | POA: Diagnosis not present

## 2020-02-14 DIAGNOSIS — K5900 Constipation, unspecified: Secondary | ICD-10-CM | POA: Insufficient documentation

## 2020-02-14 DIAGNOSIS — G809 Cerebral palsy, unspecified: Secondary | ICD-10-CM | POA: Diagnosis not present

## 2020-02-14 DIAGNOSIS — Q513 Bicornate uterus: Secondary | ICD-10-CM | POA: Insufficient documentation

## 2020-02-14 DIAGNOSIS — N131 Hydronephrosis with ureteral stricture, not elsewhere classified: Secondary | ICD-10-CM | POA: Diagnosis not present

## 2020-02-14 LAB — URINALYSIS, ROUTINE W REFLEX MICROSCOPIC
Bilirubin Urine: NEGATIVE
Glucose, UA: NEGATIVE mg/dL
Hgb urine dipstick: NEGATIVE
Ketones, ur: NEGATIVE mg/dL
Leukocytes,Ua: NEGATIVE
Nitrite: NEGATIVE
Protein, ur: NEGATIVE mg/dL
Specific Gravity, Urine: 1.021 (ref 1.005–1.030)
pH: 7 (ref 5.0–8.0)

## 2020-02-14 LAB — CBC WITH DIFFERENTIAL/PLATELET
Abs Immature Granulocytes: 0.03 10*3/uL (ref 0.00–0.07)
Basophils Absolute: 0.1 10*3/uL (ref 0.0–0.1)
Basophils Relative: 1 %
Eosinophils Absolute: 0 10*3/uL (ref 0.0–0.5)
Eosinophils Relative: 0 %
HCT: 37.2 % (ref 36.0–46.0)
Hemoglobin: 11.2 g/dL — ABNORMAL LOW (ref 12.0–15.0)
Immature Granulocytes: 0 %
Lymphocytes Relative: 10 %
Lymphs Abs: 0.9 10*3/uL (ref 0.7–4.0)
MCH: 24.6 pg — ABNORMAL LOW (ref 26.0–34.0)
MCHC: 30.1 g/dL (ref 30.0–36.0)
MCV: 81.8 fL (ref 80.0–100.0)
Monocytes Absolute: 0.4 10*3/uL (ref 0.1–1.0)
Monocytes Relative: 4 %
Neutro Abs: 7.7 10*3/uL (ref 1.7–7.7)
Neutrophils Relative %: 85 %
Platelets: 284 10*3/uL (ref 150–400)
RBC: 4.55 MIL/uL (ref 3.87–5.11)
RDW: 16.1 % — ABNORMAL HIGH (ref 11.5–15.5)
WBC: 9.1 10*3/uL (ref 4.0–10.5)
nRBC: 0 % (ref 0.0–0.2)

## 2020-02-14 LAB — COMPREHENSIVE METABOLIC PANEL
ALT: 26 U/L (ref 0–44)
AST: 34 U/L (ref 15–41)
Albumin: 4.2 g/dL (ref 3.5–5.0)
Alkaline Phosphatase: 73 U/L (ref 38–126)
Anion gap: 13 (ref 5–15)
BUN: 11 mg/dL (ref 6–20)
CO2: 27 mmol/L (ref 22–32)
Calcium: 9.1 mg/dL (ref 8.9–10.3)
Chloride: 100 mmol/L (ref 98–111)
Creatinine, Ser: 0.84 mg/dL (ref 0.44–1.00)
GFR calc Af Amer: >60 mL/min (ref >60)
GFR calc non Af Amer: >60 mL/min (ref >60)
Glucose, Bld: 136 mg/dL — ABNORMAL HIGH (ref 70–99)
Potassium: 3.8 mmol/L (ref 3.5–5.1)
Sodium: 140 mmol/L (ref 135–145)
Total Bilirubin: 1 mg/dL (ref 0.3–1.2)
Total Protein: 7.7 g/dL (ref 6.5–8.1)

## 2020-02-14 LAB — LIPASE, BLOOD: Lipase: 18 U/L (ref 11–51)

## 2020-02-14 LAB — I-STAT BETA HCG BLOOD, ED (MC, WL, AP ONLY): I-stat hCG, quantitative: <5 m[IU]/mL (ref <5)

## 2020-02-14 LAB — I-STAT CREATININE, ED: Creatinine, Ser: 0.8 mg/dL (ref 0.44–1.00)

## 2020-02-14 LAB — LACTIC ACID, PLASMA
Lactic Acid, Venous: 3.5 mmol/L (ref 0.5–1.9)
Lactic Acid, Venous: 1.9 mmol/L (ref 0.5–1.9)

## 2020-02-14 MED ORDER — SODIUM CHLORIDE (PF) 0.9 % IJ SOLN
INTRAMUSCULAR | Status: AC
  Filled 2020-02-14: qty 50

## 2020-02-14 MED ORDER — IOHEXOL 300 MG/ML  SOLN
75.0000 mL | Freq: Once | INTRAMUSCULAR | Status: AC | PRN
  Administered 2020-02-14: 75 mL via INTRAVENOUS

## 2020-02-14 MED ORDER — KETAMINE HCL 50 MG/ML IJ SOLN
4.0000 mg/kg | Freq: Once | INTRAMUSCULAR | Status: AC
  Administered 2020-02-14: 100 mg via INTRAMUSCULAR
  Filled 2020-02-14: qty 10

## 2020-02-14 MED ORDER — DICYCLOMINE HCL 20 MG PO TABS: 10.0000 mg | ORAL_TABLET | Freq: Two times a day (BID) | ORAL | 0 refills | Status: AC | PRN

## 2020-02-14 MED ORDER — SENNOSIDES-DOCUSATE SODIUM 8.6-50 MG PO TABS: 1.0000 | ORAL_TABLET | Freq: Every evening | ORAL | 0 refills | Status: DC | PRN

## 2020-02-14 MED ORDER — SODIUM CHLORIDE 0.9 % IV BOLUS
1000.0000 mL | Freq: Once | INTRAVENOUS | Status: AC
  Administered 2020-02-14: 1000 mL via INTRAVENOUS

## 2020-02-14 NOTE — ED Notes (Addendum)
Date and time results received: 02/14/20 1:10 PM   Test: Lactic Critical Value: 3.5  Name of Provider Notified: Abigail PA  Orders Received? Or Actions Taken?: Carley RN made aware

## 2020-02-14 NOTE — ED Provider Notes (Signed)
[facility] DEPT Provider Note   CSN: [id number] Arrival date & time: 02/14/20  [id number]     History Chief Complaint  Patient presents with  . Pelvic Pain    Maria [name] is a 44 y.o. female who  has a past medical history of Bowel incontinence, CP (cerebral palsy) ([place]), Glaucoma, Hyperlipemia, Mental retardation, Muscular dystrophy ([place]), Muscular dystrophy ([place]), Pneumonia, Seizures ([place]), Sepsis ([place]), and Urine incontinence. She was bib her mother with a cc abdominal pain. Mother gives the history. She states that for the past 2 months the patient has had intermittent abdominal swelling, pain.  The patient is nonverbal but she has been moaning and whining and not sleeping.  Mother is concerned because she has a history of uterine fibroids and the patient has not had a Pap smear in several years.  She took her to see an OB GYN who ran lab work and had her set up for Pap smear under procedural sedation in July.  Mother states that her CA 125 was elevated.  She denies that the patient has been vomiting.  She seems to have normal oral intake.  Her mother states that she has not made up as frequent bowel movements as she usually does.  She has not been running fevers.  Her mother states that she coughed up thick phlegm.  HPI     Past Medical History:  Diagnosis Date  . Bowel incontinence   . CP (cerebral palsy) ()   . Glaucoma   . Hyperlipemia   . Mental retardation   . Muscular dystrophy ([place])   . Muscular dystrophy ([place])   . Pneumonia    x 3  . Seizures ([place])    6 years none since 1987- no longer on medication , no longer sees a neurologist-   . Sepsis ([place])   . Urine incontinence     Patient Active Problem List   Diagnosis Date Noted  . Rash 10/09/2017  . Cerebral palsy ([place]) 10/09/2017  . Bacteria in urine 10/09/2017  . Lactic acidosis 10/09/2017  . Pneumonia 10/07/2017  . Respiratory failure, acute ([place]) 08/28/2015  . Pyrexia   .  Difficult intravenous access   . Sepsis ([place]) 08/27/2015  . Mental retardation 08/27/2015  . CAP (community acquired pneumonia) 08/27/2015    Past Surgical History:  Procedure Laterality Date  . CLEFT PALATE REPAIR    . EYE SURGERY Bilateral    Glucoma -   . NASAL SEPTUM SURGERY     lifted up     OB History   No obstetric history on file.     Family History  Problem Relation Age of Onset  . Addison's disease Mother   . Diabetes Mother   . GER disease Mother   . Glaucoma Father   . Diabetes Sister   . Stroke Sister     Social History   Tobacco Use  . Smoking status: Never Smoker  . Smokeless tobacco: Never Used  Substance Use Topics  . Alcohol use: No  . Drug use: No    Home Medications Prior to Admission medications   Medication Sig Start Date End Date Taking? Authorizing Provider  baclofen (LIORESAL) 10 MG tablet Take 10 mg by mouth 2 (two) times daily. 01/31/18  Yes [provider]  Cholecalciferol 25 MCG (1000 UT) tablet Take 1,000 Units by mouth daily.   Yes [provider]  ferrous sulfate 325 (65 FE) MG tablet Take 325 mg by mouth daily with breakfast.  Yes [provider]  latanoprost (XALATAN) 0.005 % ophthalmic solution Place 1 drop into the left eye at bedtime. 02/18/15  Yes [provider]  montelukast (SINGULAIR) 10 MG tablet Take 10 mg by mouth at bedtime as needed for allergies. 11/29/17  Yes [provider]  Multiple Vitamin (MULTIVITAMIN WITH MINERALS) TABS tablet Take 1 tablet by mouth daily.   Yes [provider]  simvastatin (ZOCOR) 40 MG tablet Take 40 mg by mouth every evening.   Yes [provider]  traMADol (ULTRAM) 50 MG tablet Take 50 mg by mouth every 6 (six) hours as needed for pain. 02/10/20  Yes [provider]  albuterol (PROVENTIL HFA;VENTOLIN HFA) 108 (90 Base) MCG/ACT inhaler Inhale 2 puffs into the lungs every 6 (six) hours as needed for wheezing or shortness of  breath. Use 2 puffs 3 times daily x 5 days, then every 6 hours as needed. Patient not taking: Reported on 08/26/2019 10/09/17   Eugenie Filler, MD    Allergies    Patient has no known allergies.  Review of Systems   Review of Systems  Unable to perform ROS: Patient nonverbal    Physical Exam Updated Vital Signs BP (!) 148/93   Pulse (!) 122   Temp 99.1 F (37.3 C) (Rectal)   Resp (!) 24   Ht 4\' 8"  (1.422 m)   Wt 25.4 kg   SpO2 100%   BMI 12.55 kg/m   Physical Exam Vitals and nursing note reviewed.  Constitutional:      General: She is not in acute distress.    Appearance: She is well-developed. She is not diaphoretic.  HENT:     Head: Normocephalic and atraumatic.  Eyes:     General: No scleral icterus.    Conjunctiva/sclera: Conjunctivae normal.  Cardiovascular:     Rate and Rhythm: Normal rate and regular rhythm.     Heart sounds: Normal heart sounds. No murmur. No friction rub. No gallop.   Pulmonary:     Effort: Pulmonary effort is normal. No respiratory distress.     Breath sounds: Normal breath sounds.  Abdominal:     General: Bowel sounds are normal. There is no distension.     Palpations: Abdomen is soft. There is no mass.     Tenderness: There is no guarding.  Musculoskeletal:     Cervical back: Normal range of motion.  Skin:    General: Skin is warm and dry.  Neurological:     Mental Status: She is alert. Mental status is at baseline.     ED Results / Procedures / Treatments   Labs (all labs ordered are listed, but only abnormal results are displayed) Labs Reviewed  URINALYSIS, ROUTINE W REFLEX MICROSCOPIC - Abnormal; Notable for the following components:      Result Value   Color, Urine STRAW (*)    All other components within normal limits  COMPREHENSIVE METABOLIC PANEL - Abnormal; Notable for the following components:   Glucose, Bld 136 (*)    All other components within normal limits  CBC WITH DIFFERENTIAL/PLATELET - Abnormal; Notable  for the following components:   Hemoglobin 11.2 (*)    MCH 24.6 (*)    RDW 16.1 (*)    All other components within normal limits  LACTIC ACID, PLASMA - Abnormal; Notable for the following components:   Lactic Acid, Venous 3.5 (*)    All other components within normal limits  CULTURE, BLOOD (ROUTINE X 2)  CULTURE, BLOOD (ROUTINE X  2)  LIPASE, BLOOD  LACTIC ACID, PLASMA  I-STAT CREATININE, ED  I-STAT BETA HCG BLOOD, ED ([facility], [facility], [facility] ONLY)    EKG None  Radiology DG Chest 2 View  Result Date: 02/14/2020 CLINICAL DATA:  Productive cough. EXAM: CHEST - 2 VIEW COMPARISON:  October 28, 2017. FINDINGS: The heart size and mediastinal contours are within normal limits. Stable elevated left hemidiaphragm is noted. Minimal bibasilar subsegmental atelectasis is noted. The visualized skeletal structures are unremarkable. IMPRESSION: Minimal bibasilar subsegmental atelectasis. Electronically Signed   By: Marijo Conception M.D.   On: 02/14/2020 08:40   CT ABDOMEN PELVIS W CONTRAST  Result Date: 02/14/2020 CLINICAL DATA:  Abdominal distension, history of muscular dystrophy EXAM: CT ABDOMEN AND PELVIS WITH CONTRAST TECHNIQUE: Multidetector CT imaging of the abdomen and pelvis was performed using the standard protocol following bolus administration of intravenous contrast. CONTRAST:  66mL OMNIPAQUE IOHEXOL 300 MG/ML  SOLN COMPARISON:  None. FINDINGS: Lower chest: No acute abnormality. Hepatobiliary: No solid liver abnormality is seen. No gallstones, gallbladder wall thickening, or biliary dilatation. Pancreas: Unremarkable. No pancreatic ductal dilatation or surrounding inflammatory changes. Spleen: Normal in size without significant abnormality. Adrenals/Urinary Tract: Adrenal glands are unremarkable. Moderate right hydronephrosis and hydroureter, with caliber change in the proximal third of the right ureter, similar to prior examination. Bladder is unremarkable. Stomach/Bowel: Stomach is within normal limits.  Appendix is not clearly visualized. No evidence of bowel wall thickening, distention, or inflammatory changes. Large burden of stool throughout the colon. Vascular/Lymphatic: No significant vascular findings are present. No enlarged abdominal or pelvic lymph nodes. Reproductive: Uterine didelphys. Probable fibroid of the left uterine horn. Other: No abdominal wall hernia or abnormality. No abdominopelvic ascites. Musculoskeletal: No acute or significant osseous findings. IMPRESSION: 1. 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 Functional degree of obstruction may be assessed by nuclear scintigraphic Lasix renal scan on a nonemergent basis. 2. Large burden of stool throughout the colon. 3. Uterine didelphys. Probable fibroid of the left uterine horn. Electronically Signed   By: Eddie Candle M.D.   On: 02/14/2020 13:06    Procedures .Critical Care Performed by: Margarita Mail, PA-C Authorized by: Margarita Mail, PA-C   Critical care provider statement:    Critical care time (minutes):  90   Critical care time was exclusive of:  Separately billable procedures and treating other patients   Critical care was time spent personally by me on the following activities:  Discussions with consultants, evaluation of patient's response to treatment, examination of patient, ordering and performing treatments and interventions, ordering and review of laboratory studies, ordering and review of radiographic studies, pulse oximetry, re-evaluation of patient's condition, obtaining history from patient or surrogate and review of old charts   (including critical care time)  Medications Ordered in ED Medications  sodium chloride (PF) 0.9 % injection (has no administration in time range)  ketamine (KETALAR) injection 100 mg (100 mg Intramuscular Given 02/14/20 1204)  sodium  chloride 0.9 % bolus 1,000 mL (1,000 mLs Intravenous New Bag/Given (Non-Interop) 02/14/20 1255)  iohexol (OMNIPAQUE) 300 MG/ML solution 75 mL (75 mLs Intravenous Contrast Given 02/14/20 1233)    ED Course  I have reviewed the triage vital signs and the nursing notes.  Pertinent labs & imaging results that were available during my care of the patient were reviewed by me and considered in my medical decision making (see chart for details).  Clinical Course as of Feb 14 1344  Fri Feb 14, 2020  1313 Creatinine: 0.84 [AH]    Clinical Course User Index [AH] Margarita Mail, PA-C   MDM Rules/Calculators/A&P                      44 year old female with a past medical history of cerebral palsy, MR, nonverbal.  She is brought in by her mother for severe abdominal pain.  Patient required extensive intervention including sedation for any work-up.  Please see sedation note by Dr. Kathrynn Humble.  Patient required multiple resources.  I was at the patient's bedside for over an hour during sedation and CT scan.  Patient's work-up showed CBC with mild normocytic anemia of insignificant value.  CMP without significant abnormality.  She does have slightly elevated blood glucose codes.  Urine is negative for infection.  Initial lactic acid elevated but resolved easily after fluids and does not represent sepsis in this patient.  She is afebrile and hemodynamically stable. 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  She is a large stool burden.  Suspect that her pain is secondary to constipation.  I discussed all findings with the patient's mother at bedside.  Will discharge with Senokot and Bentyl.  She is advised to follow closely with urology and her OB/GYN.  She appears otherwise reasonably examined and stabilized.  Discussed return precautions with the patient's mother.  Patient  appears appropriate for discharge at this time  Final Clinical Impression(s) / ED Diagnoses Final diagnoses:  None    Rx / DC Orders ED Discharge Orders    None       Margarita Mail, PA-C 02/14/20 2054    Varney Biles, MD 02/15/20 1326

## 2020-02-14 NOTE — Discharge Instructions (Signed)
Contact a health care provider if:  You have pain that gets worse.  You have a fever.  You do not have a bowel movement after 4 days.  You vomit.  You are not hungry.  You lose weight.  You are bleeding from the anus.  You have thin, pencil-like stools.  Get help right away if:  You have a fever and your symptoms suddenly get worse.  You leak stool or have blood in your stool.  Your abdomen is bloated.  You have severe pain in your abdomen.  You feel dizzy or you faint.

## 2020-02-14 NOTE — ED Triage Notes (Signed)
Pt BIB mother. She reports that patient has been having intense cramping and pelvic pain. She has been seen by her OBGYN for heavy menstrual bleeding recently, but mother states that the bleeding has stopped. Mother said that patient is not sleeping. Mother has given tramadol as prescribed without relief. Mother also reports that patient coughed up a yellow, malodorous piece of phlegm after dinner. Mother brought a sample of it.

## 2020-02-14 NOTE — ED Notes (Signed)
Per MD and PA do not worry about drawing second set of blood cultures due to pt being a difficult stick.

## 2020-02-19 LAB — CULTURE, BLOOD (ROUTINE X 2)
Culture: NO GROWTH
Special Requests: ADEQUATE

## 2020-03-10 ENCOUNTER — Emergency Department (HOSPITAL_COMMUNITY)
Admission: EM | Admit: 2020-03-10 | Discharge: 2020-03-10 | Discharge status: Against Medical Advice | Payer: Medicare Other | Source: Home / Self Care

## 2020-03-10 ENCOUNTER — Emergency Department (HOSPITAL_COMMUNITY)
Admission: EM | Admit: 2020-03-10 | Discharge: 2020-03-10 | Disposition: A | Discharge status: Home / Self Care | Payer: Medicare Other | Attending: Emergency Medicine | Admitting: Emergency Medicine

## 2020-03-10 ENCOUNTER — Encounter (HOSPITAL_COMMUNITY): Payer: Self-pay | Admitting: Emergency Medicine

## 2020-03-10 ENCOUNTER — Other Ambulatory Visit: Payer: Self-pay

## 2020-03-10 DIAGNOSIS — K5900 Constipation, unspecified: Secondary | ICD-10-CM | POA: Insufficient documentation

## 2020-03-10 DIAGNOSIS — Z5321 Procedure and treatment not carried out due to patient leaving prior to being seen by health care provider: Secondary | ICD-10-CM | POA: Diagnosis not present

## 2020-03-10 NOTE — ED Triage Notes (Signed)
Patient here from home with complaints of constipation. Reports that she was seen on 5/28 for same and given meds with no relief. No bowl movement in 1 week.

## 2020-03-10 NOTE — ED Triage Notes (Signed)
Mother stated, She's constipated has not gone in a week, and I know she is in pain.

## 2020-03-24 NOTE — Progress Notes (Signed)
Spoke with Jarrett Soho at Dr. Ivor Costa office to make her aware that Maria [name] was not a candidate to have surgery at the [facility].

## 2020-03-27 ENCOUNTER — Other Ambulatory Visit (HOSPITAL_COMMUNITY)
Admission: RE | Admit: 2020-03-27 | Discharge: 2020-03-27 | Disposition: A | Discharge status: Home / Self Care | Payer: Medicare Other | Source: Ambulatory Visit | Attending: Obstetrics and Gynecology | Admitting: Obstetrics and Gynecology

## 2020-03-27 DIAGNOSIS — Z20822 Contact with and (suspected) exposure to covid-19: Secondary | ICD-10-CM | POA: Insufficient documentation

## 2020-03-27 DIAGNOSIS — Z01812 Encounter for preprocedural laboratory examination: Secondary | ICD-10-CM | POA: Diagnosis present

## 2020-03-27 LAB — SARS CORONAVIRUS 2 (TAT 6-24 HRS): SARS Coronavirus 2: NEGATIVE
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
## 2020-03-30 ENCOUNTER — Other Ambulatory Visit: Payer: Self-pay

## 2020-03-30 ENCOUNTER — Encounter (HOSPITAL_COMMUNITY): Payer: Self-pay | Admitting: Obstetrics and Gynecology

## 2020-03-30 NOTE — Anesthesia Preprocedure Evaluation (Addendum)
Anesthesia Evaluation  Patient identified by MRN, date of birth, ID band Patient unresponsive    Reviewed: Allergy & Precautions, NPO status , Patient's Chart, lab work & pertinent test results  Airway Mallampati: II  TM Distance: >3 FB Neck ROM: Full    Dental  (+) Dental Advisory Given, Edentulous Upper, Edentulous Lower   Pulmonary neg pulmonary ROS,    Pulmonary exam normal breath sounds clear to auscultation       Cardiovascular negative cardio ROS Normal cardiovascular exam Rhythm:Regular Rate:Normal     Neuro/Psych Seizures -,  PSYCHIATRIC DISORDERS (MR) Cerebral palsy    Neuromuscular disease    GI/Hepatic Neg liver ROS, GERD  ,  Endo/Other  negative endocrine ROS  Renal/GU Renal disease (Right hydronephrosis)     Musculoskeletal negative musculoskeletal ROS (+)   Abdominal   Peds  Hematology negative hematology ROS (+)   Anesthesia Other Findings   Reproductive/Obstetrics negative OB ROS                            Anesthesia Physical Anesthesia Plan  ASA: III  Anesthesia Plan: General   Post-op Pain Management:    Induction: Intravenous and Inhalational  PONV Risk Score and Plan: 4 or greater and Midazolam, Ondansetron and Dexamethasone  Airway Management Planned: LMA  Additional Equipment:   Intra-op Plan:   Post-operative Plan: Extubation in OR  Informed Consent: I have reviewed the patients History and Physical, chart, labs and discussed the procedure including the risks, benefits and alternatives for the proposed anesthesia with the patient or authorized representative who has indicated his/her understanding and acceptance.     History available from chart only and Consent reviewed with POA  Plan Discussed with: CRNA  Anesthesia Plan Comments: (See PAT note written 03/30/2020 by Myra Gianotti, PA-C.  Has tolerated inhalational induction and volatile  anesthetics previously.)      Anesthesia Quick Evaluation

## 2020-03-30 NOTE — Progress Notes (Signed)
Anesthesia Chart Review: SAME DAY WORK-UP   Case: [id number] Date/Time: 03/31/20 0715   Procedures:      EXAM UNDER ANESTHESIA, PAP AND BREAST EXAM (N/A )     OPERATIVE ULTRASOUND (N/A )     DILATATION & CURETTAGE/HYSTEROSCOPY WITH POSSIBLE MYOSURE or POSSIBLE ENDOMETRIAL BIOPSY (N/A )   Anesthesia type: General   Pre-op diagnosis: acute pelvic pain   Location: [facility] OR ROOM 07 / [place] OR   Surgeons: Sherlyn Hay, DO      DISCUSSION: Patient is a 44 year old female scheduled for the above procedure. Per Dr. Ivor Costa H&P, attempts to get last GYN records from exam under anesthesia while in [place] have been unsuccessful.   History includes never smoker, cerebral palsy, intellectual disability, muscular dystrophy (described in some records as, "a rare disease since childhood without a definitive name that she has had intermittent periods of growth retardation"), seizures, HLD, congenital glaucoma (s/p eye surgery; eye exam under anesthesia 08/26/13, 02/14/18), cleft lip/palate repair with nose surgery (~ 2011), CAP/sepsis (08/2015; left CAP 09/2017), uterine fibroid/AUB with [place] finding of uterine didelphys (02/05/16), GERD, pre-diabetes, incontinence, non-verbal.  Neurology records in [place] indicate that prior to moving with her mother to [place], she lived in [place] for about 6 years but had spend most of her life in the [place] areas. Her last known neurology evaluation [facility].  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  At one point she had seizures but were in remission.  She had scoliosis and cleft lip and palate, s/p repair. She had minimal speech which stopped as a child. She was not able to sit up until around age 22. At the time of that visit, she was able to crawl around and walk with a walker. She was able to eat chopped or purree foods with assistance, but weighed only 56 lbs. Dr.  Vallarie Mare did not have records regarding what diagnostic testing had been completed during her childhood. She was noted to have low tone, but was hyperreflexic throughout, "making a peripheral process less likely." He added, "While she is weak and has reduced tone, I don't see specific atrophy and her hyperreflexia also reduces the likelihood of a congenital myopathy/neuropathy syndrome combined with microcephaly and other dysmorphisms. Given the lack of extant records and her mother's very limited understanding of the nature of her previous evalutions and lack of family history, I don't think pursuing a definitive diagnosis is in her interest so recommend supportive care as able. No neurological follow-up is needed."  She was referred to cardiology in 2019 for abnormal EKG (non-specific T wave abnormality with short PR). She was seen at [facility] by Cleophus Molt, MD on 10/05/17. Echo was done as part of pre-operative evaluation prior to eye exam under general anesthesia, and showed normal LVF, RVF, mild TR, no obvious significant valvular abnormalities.   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  and we have no idea whether this has been present previously. She underwent serial renal US x2, last 05/16/16 with mild-moderate right hydronephrosis, suspected stable. Creatinine 0.8 on 02/14/20.   03/27/2020 preprocedure COVID-19 test negative. Reviewed history with anesthesiologist Nolon Nations, MD. Anesthesia team to evaluate  on the day of surgery.    VS: Wt 25.4 kg   LMP 03/02/2020   BMI 12.55 kg/m   As of 02/14/20: WT 25.4 kg, HT 4\' 8" . On 03/10/20: BP 140/92, HR 102.   PROVIDERS: Helane Rima, MD is PCP ([place], see Care Everywhere) - As above, saw cardiologist Cleophus Molt, MD in 2019, urologist Tresa Endo, MD in 2017 and neurologist Caress, Jeneen Rinks, MD in 2018.   LABS: Currently, last lab results include: Lab Results  Component Value Date   WBC 9.1 02/14/2020   HGB 11.2 (L) 02/14/2020   HCT 37.2 02/14/2020   PLT 284 02/14/2020   GLUCOSE 136 (H) 02/14/2020   ALT 26 02/14/2020   AST 34 02/14/2020   NA 140 02/14/2020   K 3.8 02/14/2020   CL 100 02/14/2020   CREATININE 0.80 02/14/2020   BUN 11 02/14/2020   CO2 27 02/14/2020  POCT A1c 6.0% on 06/12/19 ([place]).    IMAGES: CXR 02/14/20: FINDINGS: The heart size and mediastinal contours are within normal limits. Stable elevated left hemidiaphragm is noted. Minimal bibasilar subsegmental atelectasis is noted. The visualized skeletal structures are unremarkable. IMPRESSION: Minimal bibasilar subsegmental atelectasis.  CT abd/pelvis 02/14/20: IMPRESSION: 1. 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 Functional degree of obstruction may be assessed by nuclear scintigraphic Lasix renal scan on a nonemergent basis. 2. Large burden of stool throughout the colon. 3. Uterine didelphys. Probable fibroid of the left uterine horn. - ED provider advised follow-up with GYN and urology  US Renal 05/16/16 [facility]): 1. Mild to moderate right-sided hydroureteronephrosis. The distal ureter is not visualized. The degree of hydronephrosis appears slightly increased from 02/05/2016.  2. Negative for hydronephrosis on the left.  3. Both kidneys measure atrophic in size, although may be congruent with the patient's small stature.   EKG: Last EKG seen if from 10/28/17: Sinus rhythm Ventricular premature complex Aberrant conduction of SV complex(es) Short PR  interval Borderline T abnormalities, diffuse leads Prolonged QT interval (QT 478, QTc 608 ms) No old tracing to compare Confirmed by Malvin Johns [phone]) on 10/29/2017 9:22:22 PM   CV: Echo 10/27/2017 [facility]; scanned under Media tab, Correspondence, 02/14/18): Summary: 1.  Study was technically difficult. 2.  LV normal in size, wall thickness and wall motion with EF 60-65%.  LV diastolic function normal. 3.  RV grossly normal in size and function. 4.  Mild 1+ tricuspid regurgitation. 5.  Pulmonary hypertension is not suggested by Doppler findings. 6.  No obvious significant functional abnormalities of the cardiac valves but with limited visualization of the valvular structure. 7.  No pericardial effusion. 8.  IVC not well-visualized.   Past Medical History:  Diagnosis Date  . Abnormal uterine bleeding (AUB)   . Bowel incontinence   . CP (cerebral palsy) ([place])   . GERD (gastroesophageal reflux disease)   . Glaucoma   . Hydronephrosis of right kidney   . Hyperlipemia   . Mental retardation    non verbal  . Muscular dystrophy ([place])   . Pneumonia    x 3  . Pre-diabetes   . Seizures ([place])    6 years none since 1987- no longer on medication , no longer sees a neurologist-   . Sepsis ([place])   . Urine incontinence   . Uterine fibroid     Past Surgical History:  Procedure Laterality Date  . CLEFT PALATE REPAIR    . EYE SURGERY Bilateral    Glucoma -   . NASAL SEPTUM SURGERY     lifted up    MEDICATIONS: No current facility-administered medications for this encounter.   . baclofen (LIORESAL) 10 MG tablet  . Cholecalciferol (DIALYVITE VITAMIN D 5000) 125 MCG (5000 UT) capsule  . ferrous sulfate 325 (65 FE) MG tablet  . latanoprost (XALATAN) 0.005 % ophthalmic solution  . Multiple Vitamin (MULTIVITAMIN WITH MINERALS) TABS tablet  . simvastatin (ZOCOR) 40 MG tablet  . traMADol (ULTRAM) 50 MG tablet  . dicyclomine (BENTYL) 20 MG tablet  . senna-docusate  (SENOKOT-S) 8.6-50 MG tablet    Myra Gianotti, PA-C Surgical Short Stay/Anesthesiology [facility] Phone [phone] [facility] Phone [phone] 03/30/2020 3:29 PM
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
## 2020-03-31 ENCOUNTER — Other Ambulatory Visit (HOSPITAL_COMMUNITY): Payer: Self-pay | Admitting: Obstetrics and Gynecology

## 2020-03-31 ENCOUNTER — Inpatient Hospital Stay (HOSPITAL_COMMUNITY): Admission: RE | Admit: 2020-03-31 | Payer: Medicare Other | Source: Ambulatory Visit

## 2020-03-31 ENCOUNTER — Ambulatory Visit (HOSPITAL_COMMUNITY): Payer: Medicare Other | Admitting: Vascular Surgery

## 2020-03-31 ENCOUNTER — Ambulatory Visit (HOSPITAL_COMMUNITY)
Admission: RE | Admit: 2020-03-31 | Discharge: 2020-03-31 | Disposition: A | Discharge status: Home / Self Care | Payer: Medicare Other | Attending: Obstetrics and Gynecology | Admitting: Obstetrics and Gynecology

## 2020-03-31 ENCOUNTER — Ambulatory Visit (HOSPITAL_COMMUNITY)
Admission: RE | Admit: 2020-03-31 | Discharge: 2020-03-31 | Disposition: A | Discharge status: Home / Self Care | Payer: Medicare Other | Source: Ambulatory Visit | Attending: Obstetrics and Gynecology | Admitting: Obstetrics and Gynecology

## 2020-03-31 ENCOUNTER — Encounter (HOSPITAL_COMMUNITY): Payer: Self-pay | Admitting: Obstetrics and Gynecology

## 2020-03-31 ENCOUNTER — Encounter (HOSPITAL_COMMUNITY)
Admission: RE | Disposition: A | Discharge status: Home / Self Care | Payer: Self-pay | Source: Home / Self Care | Attending: Obstetrics and Gynecology

## 2020-03-31 DIAGNOSIS — K219 Gastro-esophageal reflux disease without esophagitis: Secondary | ICD-10-CM | POA: Diagnosis not present

## 2020-03-31 DIAGNOSIS — H409 Unspecified glaucoma: Secondary | ICD-10-CM | POA: Insufficient documentation

## 2020-03-31 DIAGNOSIS — R102 Pelvic and perineal pain: Secondary | ICD-10-CM | POA: Insufficient documentation

## 2020-03-31 DIAGNOSIS — Z20822 Contact with and (suspected) exposure to covid-19: Secondary | ICD-10-CM | POA: Insufficient documentation

## 2020-03-31 DIAGNOSIS — Z833 Family history of diabetes mellitus: Secondary | ICD-10-CM | POA: Insufficient documentation

## 2020-03-31 DIAGNOSIS — R1900 Intra-abdominal and pelvic swelling, mass and lump, unspecified site: Secondary | ICD-10-CM | POA: Insufficient documentation

## 2020-03-31 DIAGNOSIS — Z79899 Other long term (current) drug therapy: Secondary | ICD-10-CM | POA: Insufficient documentation

## 2020-03-31 DIAGNOSIS — E785 Hyperlipidemia, unspecified: Secondary | ICD-10-CM | POA: Diagnosis not present

## 2020-03-31 DIAGNOSIS — N1330 Unspecified hydronephrosis: Secondary | ICD-10-CM | POA: Diagnosis not present

## 2020-03-31 DIAGNOSIS — Z823 Family history of stroke: Secondary | ICD-10-CM | POA: Diagnosis not present

## 2020-03-31 DIAGNOSIS — Q5128 Other doubling of uterus, other specified: Secondary | ICD-10-CM | POA: Diagnosis not present

## 2020-03-31 DIAGNOSIS — R32 Unspecified urinary incontinence: Secondary | ICD-10-CM | POA: Diagnosis not present

## 2020-03-31 DIAGNOSIS — F79 Unspecified intellectual disabilities: Secondary | ICD-10-CM | POA: Insufficient documentation

## 2020-03-31 DIAGNOSIS — G809 Cerebral palsy, unspecified: Secondary | ICD-10-CM | POA: Diagnosis not present

## 2020-03-31 DIAGNOSIS — Z820 Family history of epilepsy and other diseases of the nervous system: Secondary | ICD-10-CM | POA: Insufficient documentation

## 2020-03-31 DIAGNOSIS — Z8379 Family history of other diseases of the digestive system: Secondary | ICD-10-CM | POA: Insufficient documentation

## 2020-03-31 DIAGNOSIS — Z8489 Family history of other specified conditions: Secondary | ICD-10-CM | POA: Insufficient documentation

## 2020-03-31 DIAGNOSIS — N938 Other specified abnormal uterine and vaginal bleeding: Secondary | ICD-10-CM | POA: Diagnosis present

## 2020-03-31 DIAGNOSIS — G7100 Muscular dystrophy, unspecified: Secondary | ICD-10-CM | POA: Insufficient documentation

## 2020-03-31 DIAGNOSIS — Z791 Long term (current) use of non-steroidal anti-inflammatories (NSAID): Secondary | ICD-10-CM | POA: Insufficient documentation

## 2020-03-31 HISTORY — DX: Abnormal uterine and vaginal bleeding, unspecified: N93.9

## 2020-03-31 HISTORY — DX: Leiomyoma of uterus, unspecified: D25.9

## 2020-03-31 HISTORY — PX: OPERATIVE ULTRASOUND: SHX5996

## 2020-03-31 HISTORY — PX: DILATATION & CURETTAGE/HYSTEROSCOPY WITH MYOSURE: SHX6511

## 2020-03-31 HISTORY — DX: Prediabetes: R73.03

## 2020-03-31 HISTORY — DX: Unspecified hydronephrosis: N13.30

## 2020-03-31 HISTORY — DX: Gastro-esophageal reflux disease without esophagitis: K21.9

## 2020-03-31 LAB — COMPREHENSIVE METABOLIC PANEL
ALT: 20 U/L (ref 0–44)
AST: 37 U/L (ref 15–41)
Albumin: 3.9 g/dL (ref 3.5–5.0)
Alkaline Phosphatase: 62 U/L (ref 38–126)
Anion gap: 12 (ref 5–15)
BUN: 7 mg/dL (ref 6–20)
CO2: 24 mmol/L (ref 22–32)
Calcium: 9.5 mg/dL (ref 8.9–10.3)
Chloride: 105 mmol/L (ref 98–111)
Creatinine, Ser: 0.75 mg/dL (ref 0.44–1.00)
GFR calc Af Amer: >60 mL/min (ref >60)
GFR calc non Af Amer: >60 mL/min (ref >60)
Glucose, Bld: 126 mg/dL — ABNORMAL HIGH (ref 70–99)
Potassium: 4.1 mmol/L (ref 3.5–5.1)
Sodium: 141 mmol/L (ref 135–145)
Total Bilirubin: 0.8 mg/dL (ref 0.3–1.2)
Total Protein: 7 g/dL (ref 6.5–8.1)

## 2020-03-31 SURGERY — EXAM UNDER ANESTHESIA
Anesthesia: General

## 2020-03-31 MED ORDER — FENTANYL CITRATE (PF) 100 MCG/2ML IJ SOLN
INTRAMUSCULAR | Status: DC | PRN
  Administered 2020-03-31 (×2): 25 ug via INTRAVENOUS

## 2020-03-31 MED ORDER — LIDOCAINE 2% (20 MG/ML) 5 ML SYRINGE
INTRAMUSCULAR | Status: AC
  Filled 2020-03-31: qty 5

## 2020-03-31 MED ORDER — LACTATED RINGERS IV SOLN: INTRAVENOUS | Status: DC

## 2020-03-31 MED ORDER — DEXAMETHASONE SODIUM PHOSPHATE 10 MG/ML IJ SOLN
INTRAMUSCULAR | Status: DC | PRN
  Administered 2020-03-31: 2 mg via INTRAVENOUS

## 2020-03-31 MED ORDER — ONDANSETRON HCL 4 MG/2ML IJ SOLN
INTRAMUSCULAR | Status: DC | PRN
  Administered 2020-03-31: 2 mg via INTRAVENOUS

## 2020-03-31 MED ORDER — 0.9 % SODIUM CHLORIDE (POUR BTL) OPTIME
TOPICAL | Status: DC | PRN
  Administered 2020-03-31: 1000 mL

## 2020-03-31 MED ORDER — SODIUM CHLORIDE 0.9 % IR SOLN
Status: DC | PRN
  Administered 2020-03-31: 3000 mL

## 2020-03-31 MED ORDER — ONDANSETRON HCL 4 MG/2ML IJ SOLN
INTRAMUSCULAR | Status: AC
  Filled 2020-03-31: qty 2

## 2020-03-31 MED ORDER — POVIDONE-IODINE 10 % EX SWAB: 2.0000 "application " | Freq: Once | CUTANEOUS | Status: DC

## 2020-03-31 MED ORDER — PROPOFOL 10 MG/ML IV BOLUS
INTRAVENOUS | Status: DC | PRN
  Administered 2020-03-31: 40 mg via INTRAVENOUS
  Administered 2020-03-31: 30 mg via INTRAVENOUS

## 2020-03-31 MED ORDER — ONDANSETRON HCL 4 MG/2ML IJ SOLN: 4.0000 mg | Freq: Once | INTRAMUSCULAR | Status: DC | PRN

## 2020-03-31 MED ORDER — MIDAZOLAM HCL 2 MG/2ML IJ SOLN
INTRAMUSCULAR | Status: AC
  Filled 2020-03-31: qty 2

## 2020-03-31 MED ORDER — ORAL CARE MOUTH RINSE: 15.0000 mL | Freq: Once | OROMUCOSAL | Status: DC

## 2020-03-31 MED ORDER — MIDAZOLAM HCL 2 MG/ML PO SYRP
ORAL_SOLUTION | ORAL | Status: AC
  Administered 2020-03-31: 10 mg via ORAL
  Filled 2020-03-31: qty 6

## 2020-03-31 MED ORDER — ACETAMINOPHEN 500 MG PO TABS
1000.0000 mg | ORAL_TABLET | ORAL | Status: DC
  Filled 2020-03-31: qty 2

## 2020-03-31 MED ORDER — LIDOCAINE HCL 1 % IJ SOLN
INTRAMUSCULAR | Status: AC
  Filled 2020-03-31: qty 20

## 2020-03-31 MED ORDER — KETOROLAC TROMETHAMINE 30 MG/ML IJ SOLN: 30.0000 mg | Freq: Once | INTRAMUSCULAR | Status: DC

## 2020-03-31 MED ORDER — IBUPROFEN 100 MG/5ML PO SUSP: 600.0000 mg | Freq: Four times a day (QID) | ORAL | 1 refills | Status: AC | PRN

## 2020-03-31 MED ORDER — CHLORHEXIDINE GLUCONATE 0.12 % MT SOLN
15.0000 mL | Freq: Once | OROMUCOSAL | Status: DC
  Filled 2020-03-31: qty 15

## 2020-03-31 MED ORDER — FENTANYL CITRATE (PF) 100 MCG/2ML IJ SOLN: 25.0000 ug | INTRAMUSCULAR | Status: DC | PRN

## 2020-03-31 MED ORDER — MIDAZOLAM HCL 2 MG/ML PO SYRP: 10.0000 mg | ORAL_SOLUTION | Freq: Once | ORAL | Status: AC

## 2020-03-31 MED ORDER — PROPOFOL 10 MG/ML IV BOLUS
INTRAVENOUS | Status: AC
  Filled 2020-03-31: qty 40

## 2020-03-31 MED ORDER — FENTANYL CITRATE (PF) 250 MCG/5ML IJ SOLN
INTRAMUSCULAR | Status: AC
  Filled 2020-03-31: qty 5

## 2020-03-31 MED ORDER — TRAMADOL 5 MG/ML ORAL SUSPENSION: 25.0000 mg | Freq: Four times a day (QID) | ORAL | 0 refills | Status: AC | PRN

## 2020-03-31 MED ORDER — DEXAMETHASONE SODIUM PHOSPHATE 10 MG/ML IJ SOLN
INTRAMUSCULAR | Status: AC
  Filled 2020-03-31: qty 1

## 2020-03-31 MED ORDER — SODIUM CHLORIDE 0.9 % IV SOLN: INTRAVENOUS | Status: DC | PRN

## 2020-03-31 SURGICAL SUPPLY — 21 items
CANISTER SUCT 3000ML PPV (MISCELLANEOUS) ×3 IMPLANT
CATH ROBINSON RED A/P 16FR (CATHETERS) ×3 IMPLANT
COVER BACK TABLE 60X90IN (DRAPES) ×3 IMPLANT
CURETTE PIPELLE ENDOMTRL SUCTN (MISCELLANEOUS) ×3 IMPLANT
DEVICE MYOSURE LITE (MISCELLANEOUS) IMPLANT
DEVICE MYOSURE REACH (MISCELLANEOUS) IMPLANT
DILATOR CANAL MILEX (MISCELLANEOUS) ×3 IMPLANT
GLOVE BIO SURGEON STRL SZ 6.5 (GLOVE) ×2 IMPLANT
GLOVE BIO SURGEONS STRL SZ 6.5 (GLOVE) ×1
GLOVE BIOGEL PI IND STRL 7.0 (GLOVE) ×2 IMPLANT
GLOVE BIOGEL PI INDICATOR 7.0 (GLOVE) ×4
GOWN STRL REUS W/ TWL LRG LVL3 (GOWN DISPOSABLE) ×2 IMPLANT
GOWN STRL REUS W/TWL LRG LVL3 (GOWN DISPOSABLE) ×4
KIT PROCEDURE FLUENT (KITS) ×3 IMPLANT
KIT TURNOVER KIT B (KITS) ×3 IMPLANT
PACK VAGINAL MINOR WOMEN LF (CUSTOM PROCEDURE TRAY) ×6 IMPLANT
PAD OB MATERNITY 4.3X12.25 (PERSONAL CARE ITEMS) ×3 IMPLANT
PIPELLE ENDOMETRIAL SUCTION CU (MISCELLANEOUS) ×9
SEAL ROD LENS SCOPE MYOSURE (ABLATOR) ×3 IMPLANT
TOWEL GREEN STERILE FF (TOWEL DISPOSABLE) ×6 IMPLANT
UNDERPAD 30X36 HEAVY ABSORB (UNDERPADS AND DIAPERS) ×3 IMPLANT

## 2020-03-31 NOTE — Discharge Summary (Signed)
Physician Discharge Summary  Patient ID: Maria [name] MRN: [id number] DOB/AGE: Apr 15, 1976 44 y.o.  Admit date: 03/31/2020 Discharge date: 03/31/2020  Admission Diagnoses: 1. Abnormal uterine bleeding 2. Pelvic pain 3. Physical restrictions in mobility due to muscular dystrophy and cerebral palsy 4. Non verbal   Discharge Diagnoses:  Same 5. Uterine didelphys; single cervix 6. Firm mass on left side of upper vaginal vault  Active Problems:   * No active hospital problems. *   Discharged Condition: stable  Hospital Course: Pt admitted for Exam under anesthesia, breast exam, Pap smear, endometrial biopsy of both uteri under ultrasound guidance due to abnormal bleeding and pelvic pain. Pt tolerated procedure well and deemed stable for discharge on same day   Consults: None  Significant Diagnostic Studies: labs: drawn; pending  Treatments: surgery: Exam under anesthesia, breast exam, Pap smear, endometrial biopsy of both uteri under ultrasound guidance  Discharge Exam: Blood pressure 112/79, pulse 79, temperature 97.6 F (36.4 C), resp. rate 16, weight 25.4 kg, last menstrual period 03/29/2020, SpO2 100 %. General appearance: alert, cooperative and no distress  Disposition: Discharge disposition: 01-Home or Self Care       Discharge Instructions    Call MD for:  difficulty breathing, headache or visual disturbances   Complete by: As directed    Call MD for:  persistant nausea and vomiting   Complete by: As directed    Call MD for:  redness, tenderness, or signs of infection (pain, swelling, redness, odor or green/yellow discharge around incision site)   Complete by: As directed    Call MD for:  severe uncontrolled pain   Complete by: As directed    Call MD for:  temperature >100.4   Complete by: As directed    Diet - low sodium heart healthy   Complete by: As directed    Discharge instructions   Complete by: As directed    Call office with any concerns [phone]   No wound care   Complete by: As directed    Ice pack to perineum prn   Other Restrictions   Complete by: As directed    All restrictions per patient baseline mobility lifted     Allergies as of 03/31/2020   No Known Allergies     Medication List    TAKE these medications   acetaminophen 650 MG CR tablet Commonly known as: TYLENOL Take 650 mg by mouth every 8 (eight) hours as needed for pain.   baclofen 10 MG tablet Commonly known as: LIORESAL Take 10 mg by mouth 2 (two) times daily as needed for muscle spasms.   Dialyvite Vitamin D 5000 125 MCG (5000 UT) capsule Generic drug: Cholecalciferol Take 5,000 Units by mouth daily.   dicyclomine 20 MG tablet Commonly known as: BENTYL Take 0.5 tablets (10 mg total) by mouth 2 (two) times daily as needed for spasms (and belly pain).   ferrous sulfate 325 (65 FE) MG tablet Take 325 mg by mouth daily with breakfast.   ibuprofen 100 MG/5ML suspension Commonly known as: ADVIL Take 30 mLs (600 mg total) by mouth every 6 (six) hours as needed for up to 7 days for moderate pain.   latanoprost 0.005 % ophthalmic solution Commonly known as: XALATAN Place 1 drop into the left eye at bedtime.   multivitamin with minerals Tabs tablet Take 1 tablet by mouth daily.   senna-docusate 8.6-50 MG tablet Commonly known as: Senokot-S Take 1 tablet by mouth at bedtime as needed for mild  constipation.   simvastatin 40 MG tablet Commonly known as: ZOCOR Take 40 mg by mouth every evening.   traMADol 5 mg/mL Susp Commonly known as: ULTRAM Take 5 mLs (25 mg total) by mouth every 6 (six) hours as needed for up to 5 days for severe pain.   traMADol 50 MG tablet Commonly known as: ULTRAM Take 50 mg by mouth every 6 (six) hours as needed for moderate pain.       Follow-up Information    Sherlyn Hay, DO. Schedule an appointment as soon as possible for a visit in 3 weeks.   Specialty: Obstetrics and Gynecology Why:  Schedule pt for 8/5 at 1030 via telemedicine for post operative followup Contact information: [address] [place] [address] [phone]               Signed: Isaiah Serge 03/31/2020, 9:37 AM

## 2020-03-31 NOTE — Anesthesia Postprocedure Evaluation (Signed)
Anesthesia Post Note  Patient: Maria [name]  Procedure(s) Performed: EXAM UNDER ANESTHESIA, PAP AND BREAST EXAM (N/A ) OPERATIVE ULTRASOUND (N/A ) DILATATION & CURETTAGE WITH ENDOMETRIAL BIOPSY (N/A )     Patient location during evaluation: PACU Anesthesia Type: General Level of consciousness: awake and alert and awake (Neuro status at baseline) Pain management: pain level controlled Vital Signs Assessment: post-procedure vital signs reviewed and stable Respiratory status: spontaneous breathing, nonlabored ventilation and respiratory function stable Cardiovascular status: blood pressure returned to baseline and stable Postop Assessment: no apparent nausea or vomiting Anesthetic complications: no   No complications documented.  Last Vitals:  Vitals:   03/31/20 0907 03/31/20 0922  BP: 112/88 112/79  Pulse: 82 79  Resp: 20 16  Temp:  36.4 C  SpO2: 99% 100%    Last Pain:  Vitals:   03/31/20 0922  TempSrc:   PainSc: Asleep                 Catalina Gravel

## 2020-03-31 NOTE — Transfer of Care (Signed)
Immediate Anesthesia Transfer of Care Note  Patient: [name]  Procedure(s) Performed: EXAM UNDER ANESTHESIA, PAP AND BREAST EXAM (N/A ) OPERATIVE ULTRASOUND (N/A ) DILATATION & CURETTAGE WITH ENDOMETRIAL BIOPSY (N/A )  Patient Location: PACU  Anesthesia Type:General  Level of Consciousness: drowsy  Airway & Oxygen Therapy: Patient Spontanous Breathing and Patient connected to face mask oxygen  Post-op Assessment: Report given to RN and Post -op Vital signs reviewed and stable  Post vital signs: Reviewed and stable  Last Vitals:  Vitals Value Taken Time  BP 121/83 03/31/20 0852  Temp    Pulse 84   Resp 29 03/31/20 0853  SpO2 100   Vitals shown include unvalidated device data.  Last Pain:  Vitals:   03/31/20 0637  TempSrc: Axillary  PainSc: 0-No pain         Complications: No complications documented.

## 2020-03-31 NOTE — Interval H&P Note (Signed)
History and Physical Interval Note:  Pt seen with mother present. Pt non verbal. Reviewed procedure and post op expectations with mother. No change from H/. Started bleeding again today per mother. Will obtain labs with pt asleep under anesthesia to reduce stress  03/31/2020 7:35 AM  [name]  has presented today for surgery, with the diagnosis of acute pelvic pain.  The various methods of treatment have been discussed with the patient and family. After consideration of risks, benefits and other options for treatment, the patient has consented to  Procedure(s): EXAM UNDER ANESTHESIA, PAP AND BREAST EXAM (N/A) OPERATIVE ULTRASOUND (N/A) DILATATION & CURETTAGE/HYSTEROSCOPY WITH POSSIBLE MYOSURE or POSSIBLE ENDOMETRIAL BIOPSY (N/A) as a surgical intervention.  The patient's history has been reviewed, patient examined, no change in status, stable for surgery.  I have reviewed the patient's chart and labs.  Questions were answered to the patient's satisfaction.     Maria [name]

## 2020-03-31 NOTE — Discharge Instructions (Signed)
Call office with any concerns [phone] 

## 2020-03-31 NOTE — Anesthesia Procedure Notes (Addendum)
Procedure Name: LMA Insertion Date/Time: 03/31/2020 7:57 AM Performed by: Bryson Corona, CRNA Pre-anesthesia Checklist: Patient identified, Emergency Drugs available, Suction available and Patient being monitored Patient Re-evaluated:Patient Re-evaluated prior to induction Oxygen Delivery Method: Circle System Utilized Preoxygenation: Pre-oxygenation with 100% oxygen Induction Type: Inhalational induction Ventilation: Mask ventilation without difficulty LMA: LMA inserted LMA Size: 3.0 Number of attempts: 1 Placement Confirmation: positive ETCO2 and breath sounds checked- equal and bilateral Tube secured with: Tape Dental Injury: Teeth and Oropharynx as per pre-operative assessment

## 2020-03-31 NOTE — Op Note (Addendum)
Operative Note    Preoperative Diagnosis 1. Abnormal uterine bleeding 2. Pelvic pain 3. Physical restrictions in mobility due to muscular dystrophy and cerebral palsy 4. Non verbal    Postoperative Diagnosis Same 5. Uterine didelphys; single cervix 6. Firm mass on left side of upper vaginal vault    Procedure:  Exam under anesthesia, breast exam, Pap smear, endometrial biopsy of both uteri under ultrasound guidance   Surgeon: Mickle Mallory DO  Anesthesia: Dr Otho Bellows MD  Fluids: LR 500cc EBL: scant <15cc UOP: 25cc   Findings: Benign breast exam. Small atrophied single cervix; blood in vaginal vault. Uterine didelphys with thin lining; easier to access right uterus. 3x4cm lateral firm, non mobile mass in left upper vaginal vault protruding into side of vagina; limited blood flow on outside of mass on ultrasound - cannot rule out fibroid   Specimen: Pap smear sample, endometrial biopsy x 2   Procedure Note  Patient was taken to the operating room where general anesthesia was administered. Pt was noted to be in naturally laying in a contracted form with hips turned laterally to left and and lower extremities flexed however after anesthesia administered, pt was easily placed on her back and both lower extremities were noted to be freely mobile. Thus, she was then prepped and draped in the normal sterile fashion after positioned in the dorsal lithotomy position. An appropriate time out was performed. An exam under anesthesia noted an atrophied cervix, practically non existent labia minora and small uterus. A firm mass as described above was palpated about 3/4 inch inside the vaginal on the left.   A small speculum was then placed within the vagina and the anterior lip of the cervix identified and grasped with a single toothed tenaculum. A pap smear was performed.  Under ultrasound guidance, the right uterus was then sounded to 7cm. An endocervical then endometrial biopsy were performed. The  left uterine horn was noted to be harder to enter however a metal uterine sound was finally placed - measured 6cm. An endometrial biopsy was performed.  The tenaculum was removed and site noted to be hemostatic.  A vaginal probe was placed to better assess the lateral mass but not much information was gleaned. Some calcifications noted. Further evaluation via palpation did not yield more information either.  The procedure was concluded at this time.  Pt tolerated procedure well  All counts were noted to be correct per staff.  Pt was taken to recovery room in stable condition.

## 2020-04-01 ENCOUNTER — Encounter (HOSPITAL_COMMUNITY): Payer: Self-pay | Admitting: Obstetrics and Gynecology

## 2020-04-01 LAB — CYTOLOGY - PAP: Diagnosis: NEGATIVE

## 2020-04-01 LAB — SURGICAL PATHOLOGY

## 2020-04-06 ENCOUNTER — Other Ambulatory Visit: Payer: Self-pay

## 2020-04-08 ENCOUNTER — Other Ambulatory Visit: Payer: Self-pay

## 2020-04-08 ENCOUNTER — Encounter (HOSPITAL_COMMUNITY): Payer: Self-pay | Admitting: Emergency Medicine

## 2020-04-08 DIAGNOSIS — K5900 Constipation, unspecified: Secondary | ICD-10-CM | POA: Insufficient documentation

## 2020-04-08 DIAGNOSIS — R102 Pelvic and perineal pain: Secondary | ICD-10-CM | POA: Diagnosis present

## 2020-04-08 NOTE — ED Triage Notes (Signed)
Patient BIB mother, reports patient has moaning and not eating worsening x1 week. States patient has a uterine mass.

## 2020-04-09 ENCOUNTER — Emergency Department (HOSPITAL_COMMUNITY): Payer: Medicare Other

## 2020-04-09 ENCOUNTER — Emergency Department (HOSPITAL_COMMUNITY)
Admission: EM | Admit: 2020-04-09 | Discharge: 2020-04-09 | Disposition: A | Discharge status: Home / Self Care | Payer: Medicare Other | Attending: Emergency Medicine | Admitting: Emergency Medicine

## 2020-04-09 DIAGNOSIS — N390 Urinary tract infection, site not specified: Secondary | ICD-10-CM

## 2020-04-09 DIAGNOSIS — K5900 Constipation, unspecified: Secondary | ICD-10-CM | POA: Diagnosis not present

## 2020-04-09 LAB — URINALYSIS, ROUTINE W REFLEX MICROSCOPIC
Bilirubin Urine: NEGATIVE
Glucose, UA: 50 mg/dL — AB
Hgb urine dipstick: NEGATIVE
Ketones, ur: NEGATIVE mg/dL
Nitrite: POSITIVE — AB
Protein, ur: NEGATIVE mg/dL
Specific Gravity, Urine: 1.011 (ref 1.005–1.030)
pH: 7 (ref 5.0–8.0)

## 2020-04-09 LAB — COMPREHENSIVE METABOLIC PANEL
ALT: 25 U/L (ref 0–44)
AST: 30 U/L (ref 15–41)
Albumin: 4.1 g/dL (ref 3.5–5.0)
Alkaline Phosphatase: 67 U/L (ref 38–126)
Anion gap: 15 (ref 5–15)
BUN: 9 mg/dL (ref 6–20)
CO2: 21 mmol/L — ABNORMAL LOW (ref 22–32)
Calcium: 9.6 mg/dL (ref 8.9–10.3)
Chloride: 105 mmol/L (ref 98–111)
Creatinine, Ser: 0.72 mg/dL (ref 0.44–1.00)
GFR calc Af Amer: >60 mL/min (ref >60)
GFR calc non Af Amer: >60 mL/min (ref >60)
Glucose, Bld: 124 mg/dL — ABNORMAL HIGH (ref 70–99)
Potassium: 4.1 mmol/L (ref 3.5–5.1)
Sodium: 141 mmol/L (ref 135–145)
Total Bilirubin: 0.6 mg/dL (ref 0.3–1.2)
Total Protein: 7.7 g/dL (ref 6.5–8.1)

## 2020-04-09 LAB — POC URINE PREG, ED: Preg Test, Ur: NEGATIVE

## 2020-04-09 LAB — CBC WITH DIFFERENTIAL/PLATELET
Abs Immature Granulocytes: 0.03 10*3/uL (ref 0.00–0.07)
Basophils Absolute: 0 10*3/uL (ref 0.0–0.1)
Basophils Relative: 0 %
Eosinophils Absolute: 0.1 10*3/uL (ref 0.0–0.5)
Eosinophils Relative: 1 %
HCT: 43.5 % (ref 36.0–46.0)
Hemoglobin: 13.1 g/dL (ref 12.0–15.0)
Immature Granulocytes: 0 %
Lymphocytes Relative: 26 %
Lymphs Abs: 2.1 10*3/uL (ref 0.7–4.0)
MCH: 23.7 pg — ABNORMAL LOW (ref 26.0–34.0)
MCHC: 30.1 g/dL (ref 30.0–36.0)
MCV: 78.7 fL — ABNORMAL LOW (ref 80.0–100.0)
Monocytes Absolute: 0.6 10*3/uL (ref 0.1–1.0)
Monocytes Relative: 7 %
Neutro Abs: 5.2 10*3/uL (ref 1.7–7.7)
Neutrophils Relative %: 66 %
Platelets: 138 10*3/uL — ABNORMAL LOW (ref 150–400)
RBC: 5.53 MIL/uL — ABNORMAL HIGH (ref 3.87–5.11)
RDW: 16.3 % — ABNORMAL HIGH (ref 11.5–15.5)
WBC: 8 10*3/uL (ref 4.0–10.5)
nRBC: 0 % (ref 0.0–0.2)

## 2020-04-09 LAB — LIPASE, BLOOD: Lipase: 24 U/L (ref 11–51)

## 2020-04-09 MED ORDER — CEPHALEXIN 500 MG PO CAPS
500.0000 mg | ORAL_CAPSULE | Freq: Two times a day (BID) | ORAL | 0 refills | Status: AC
Start: 2020-04-09 — End: 2020-04-16

## 2020-04-09 MED ORDER — POLYETHYLENE GLYCOL 3350 17 G PO PACK: 17.0000 g | PACK | Freq: Three times a day (TID) | ORAL | 0 refills | Status: AC

## 2020-04-09 NOTE — ED Provider Notes (Signed)
Care received from [place] PA-C at shift change.  Please see her note for full HPI.  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  It was noted that her abdominal pain was at baseline, the patient has a history of chronic constipation.  She also was noted to have a small rectal lesion noted, which was deemed to be a callus, not consistent with a abscess, sinus tach, decubitus ulcer.  Her lab work did not show any leukocytosis, CMP consistent with mild dehydration with a bicarb of 21.  Glucose 124.  Evidence of pancreatitis, negative pregnancy.  Care received with UA and KUB pending.   I personally reviewed her lab work and KUB, which is consistent with UTI and constipation.  Patient was provided prescription for Keflex, and MiraLAX.  Instructed mother to for the patient to take MiraLAX 3 times a day for 7 days, and then once daily thereafter per Dr. Johnathan Hausen recommendation.  Patient has remained hemodynamically stable throughout the ED course.  Strict return precautions given, encouraged the mother to follow-up with her PCP within the next few days.  Overall, the patient has been medically screened and is stable for discharge. Physical Exam  BP 96/86 (BP Location: Right Arm)   Pulse 99   Temp 98 F (36.7 C) (Oral)   Resp 18   LMP 03/29/2020 (Exact Date)   SpO2 98%   Physical Exam Vitals and nursing note reviewed.  Constitutional:      General: She is not in acute distress.    Appearance: She is well-developed. She is not ill-appearing, toxic-appearing or diaphoretic.     Comments: Laying prone, small for age  HENT:     Head: Normocephalic and atraumatic.     Comments: microcephalic    Nose: Nose normal.     Mouth/Throat:     Mouth: Mucous membranes are moist.     Pharynx: Oropharynx is clear.  Eyes:     Conjunctiva/sclera:  Conjunctivae normal.  Cardiovascular:     Rate and Rhythm: Normal rate and regular rhythm.     Pulses: Normal pulses.     Heart sounds: Normal heart sounds. No murmur heard.   Pulmonary:     Effort: Pulmonary effort is normal. No respiratory distress.     Breath sounds: Normal breath sounds.  Abdominal:     General: Abdomen is flat.     Palpations: Abdomen is soft.     Tenderness: There is abdominal tenderness.     Comments: Generalized mild abdominal tenderness  Genitourinary:    Comments: Defer to previous provider Musculoskeletal:        General: No tenderness.     Cervical back: Normal range of motion and neck supple.     Right lower leg: No edema.     Left lower leg: No edema.  Skin:    General: Skin is warm and dry.     Findings: No erythema or rash.  Neurological:     Mental Status: She is alert. Mental status is at baseline.     Comments: Nonverbal at baseline, alert     ED Course/Procedures     Procedures  MDM         Garald Balding, PA-C 04/09/20 6568    Blanchie Dessert, MD 04/09/20 2155

## 2020-04-09 NOTE — ED Provider Notes (Signed)
[facility] DEPT Provider Note   CSN: [id number] Arrival date & time: 04/08/20  1633     History Chief Complaint  Patient presents with  . Vaginal Pain    Marranda Arakelian is a 44 y.o. female with a history of uterine Didelphys, cerebral palsy, nonverbal, muscular dystrophy, abnormal uterine bleeding, bowel incontinence, glaucoma who presents to the emergency department accompanied by her mother.  Patient is nonverbal.  The patient's mother presents with concerns that the patient has had a poor appetite over the last week.  Reports that she typically is "a good eater".   Reports that she has has been having chronic abdominal pain for many months.  Reports that she will draw of her legs and has also struggled with constipation.  She reports that she does not feel as if the patient's pain is worse today, but is concerned about her appetite.  The patient had a breast expax, Pap smear, and endometrial biopsy of both the uteri performed by Dr. Terri Piedra on 7/13.  She was discharged home with pain medication.  Her mother reports that she has been taking the pain medication, but feels that it is too much because it makes the patient sleepy.  She reports that the patient has not had any improvement in her pain despite her home prescriptions.  She is also concerned about a new wound on the patient's buttock that she just noticed.  No history of similar.  No drainage noted from the wound.  No recent fever, chills, dysuria, diarrhea, cough.  Level 5 caveat as patient is nonverbal.  The history is provided by the patient. No language interpreter was used.       Past Medical History:  Diagnosis Date  . Abnormal uterine bleeding (AUB)   . Bowel incontinence   . CP (cerebral palsy) ([place])   . GERD (gastroesophageal reflux disease)   . Glaucoma   . Hydronephrosis of right kidney   . Hyperlipemia   . Mental retardation    non verbal  . Muscular dystrophy ([place])   .  Pneumonia    x 3  . Pre-diabetes   . Seizures ([place])    6 years none since 1987- no longer on medication , no longer sees a neurologist-   . Sepsis ([place])   . Urine incontinence   . Uterine fibroid     Patient Active Problem List   Diagnosis Date Noted  . Rash 10/09/2017  . Cerebral palsy ([place]) 10/09/2017  . Bacteria in urine 10/09/2017  . Lactic acidosis 10/09/2017  . Pneumonia 10/07/2017  . Respiratory failure, acute ([place]) 08/28/2015  . Pyrexia   . Difficult intravenous access   . Sepsis ([place]) 08/27/2015  . Mental retardation 08/27/2015  . CAP (community acquired pneumonia) 08/27/2015    Past Surgical History:  Procedure Laterality Date  . CLEFT PALATE REPAIR    . DILATATION & CURETTAGE/HYSTEROSCOPY WITH MYOSURE N/A 03/31/2020   Procedure: DILATATION & CURETTAGE WITH ENDOMETRIAL BIOPSY;  Surgeon: Sherlyn Hay, DO;  Location: [facility];  Service: Gynecology;  Laterality: N/A;  . EYE SURGERY Bilateral    Glucoma -   . MULTIPLE TOOTH EXTRACTIONS    . NASAL SEPTUM SURGERY     lifted up  . OPERATIVE ULTRASOUND N/A 03/31/2020   Procedure: OPERATIVE ULTRASOUND;  Surgeon: Sherlyn Hay, DO;  Location: [facility];  Service: Gynecology;  Laterality: N/A;     OB History   No obstetric history on file.     Family  History  Problem Relation Age of Onset  . Addison's disease Mother   . Diabetes Mother   . GER disease Mother   . Glaucoma Father   . Diabetes Sister   . Stroke Sister     Social History   Tobacco Use  . Smoking status: Never Smoker  . Smokeless tobacco: Never Used  Vaping Use  . Vaping Use: Never used  Substance Use Topics  . Alcohol use: No  . Drug use: No    Home Medications Prior to Admission medications   Medication Sig Start Date End Date Taking? Authorizing Provider  ibuprofen (ADVIL) 100 MG/5ML suspension Take 600 mg by mouth every 6 (six) hours as needed for mild pain.    Yes [provider]  latanoprost (XALATAN) 0.005 %  ophthalmic solution Place 1 drop into the left eye at bedtime. 02/18/15  Yes [provider]  oxyCODONE-acetaminophen (PERCOCET/ROXICET) 5-325 MG tablet Take 1 tablet by mouth every 6 (six) hours as needed for severe pain.  04/03/20  Yes [provider]  simvastatin (ZOCOR) 40 MG tablet Take 40 mg by mouth every evening.   Yes [provider]  traMADol (ULTRAM) 50 MG tablet Take 25 mg by mouth every 6 (six) hours as needed for moderate pain.  02/10/20  Yes [provider]  dicyclomine (BENTYL) 20 MG tablet Take 0.5 tablets (10 mg total) by mouth 2 (two) times daily as needed for spasms (and belly pain). Patient not taking: Reported on 03/25/2020 02/14/20   Margarita Mail, PA-C  polyethylene glycol (MIRALAX) 17 g packet Take 17 g by mouth in the morning, at noon, and at bedtime. Please take 1 scoop 3 times a day for 7 days; then 1 scoop once a day therafter 04/09/20   Garald Balding, PA-C  senna-docusate (SENOKOT-S) 8.6-50 MG tablet Take 1 tablet by mouth at bedtime as needed for mild constipation. Patient not taking: Reported on 03/25/2020 02/14/20   Margarita Mail, PA-C    Allergies    Patient has no known allergies.  Review of Systems   Review of Systems  Unable to perform ROS: Patient nonverbal    Physical Exam Updated Vital Signs BP 96/86 (BP Location: Right Arm)   Pulse 99   Temp 98 F (36.7 C) (Oral)   Resp 18   LMP 03/29/2020 (Exact Date)   SpO2 98%   Physical Exam Vitals and nursing note reviewed.  Constitutional:      General: She is not in acute distress.    Comments: Patient is prone with her legs folded up under her abdomen.  Small for age.  HENT:     Head: Microcephalic.  Eyes:     Conjunctiva/sclera: Conjunctivae normal.     Comments: Disconjugate gaze  Cardiovascular:     Rate and Rhythm: Normal rate and regular rhythm.     Heart sounds: No murmur heard.  No friction rub. No gallop.   Pulmonary:     Effort: Pulmonary effort is  normal. No respiratory distress.  Abdominal:     General: There is no distension.     Palpations: Abdomen is soft.     Tenderness: There is abdominal tenderness. There is no guarding.     Comments: Mild generalized tenderness to palpation throughout the abdomen.  Patient has no acute distress on exam, but does push my hand away and mildly moan in discomfort.  No rebound or guarding.  Negative Murphy sign.  No tenderness over McBurney's point.  No CVA tenderness  bilaterally.  Normoactive bowel sounds.  Genitourinary:    Comments: Chaperoned exam.  There is a small, hard, callused area at the superior gluteal cleft.  It does not appear pedunculated.  No sinus tract.  No drainage.  No erythema, edema, warmth, fluctuance, or induration.  No tenderness to palpation. Musculoskeletal:     Cervical back: Neck supple.     Comments: Small head size for body. Limbs are shorter than expected limbs for trunk size  Skin:    General: Skin is warm.     Findings: No rash.  Neurological:     Mental Status: She is alert.     Comments: Mental status is at baseline.  Patient is nonverbal.  Psychiatric:        Behavior: Behavior normal.        ED Results / Procedures / Treatments   Labs (all labs ordered are listed, but only abnormal results are displayed) Labs Reviewed  CBC WITH DIFFERENTIAL/PLATELET - Abnormal; Notable for the following components:      Result Value   RBC 5.53 (*)    MCV 78.7 (*)    MCH 23.7 (*)    RDW 16.3 (*)    Platelets 138 (*)    All other components within normal limits  COMPREHENSIVE METABOLIC PANEL - Abnormal; Notable for the following components:   CO2 21 (*)    Glucose, Bld 124 (*)    All other components within normal limits  LIPASE, BLOOD  URINALYSIS, ROUTINE W REFLEX MICROSCOPIC  POC URINE PREG, ED    EKG None  Radiology DG Abdomen 1 View  Result Date: 04/09/2020 CLINICAL DATA:  Decreased appetite. EXAM: ABDOMEN - 1 VIEW COMPARISON:  CT 02/14/2020.  FINDINGS: Patient is rotated to the right. No bowel distention. Large amount of stool noted throughout the colon. No free air. Lumbar scoliosis concave left. No acute bony abnormality. IMPRESSION: Large amount of stool noted throughout the colon suggesting constipation. No bowel distention. Electronically Signed   By: Marcello Moores  Register   On: 04/09/2020 07:14    Procedures Procedures (including critical care time)  Medications Ordered in ED Medications - No data to display  ED Course  I have reviewed the triage vital signs and the nursing notes.  Pertinent labs & imaging results that were available during my care of the patient were reviewed by me and considered in my medical decision making (see chart for details).    MDM Rules/Calculators/A&P                          44 year old female with severe intellectual disabilities, muscular dystrophy, uterine Didelphys, cerebral palsy, abnormal uterine bleeding, bowel incontinence, glaucoma who is nonverbal at baseline and accompanied to the emergency department by her mother.  The patient was seen and independently evaluated by Dr. Randal Buba, attending physician.  The patient has a history of chronic abdominal pain where she moans and draws up her leg.  She has also been seen at multiple times in the ER for constipation.  Family is reporting that the patient has had abdominal pain, but they feel that it is at her baseline.  However, normally she has a voracious appetite, but she has had a poor appetite for the last week.  Of note, patient underwent breast exam, Pap smear, and endocervical biopsy in the OR with anesthesia on 7/13.  She is being followed by OB/GYN for a firm mass in the left upper vaginal vault.  She has  follow-up with OB/GYN on August 5.  On exam, she has diffuse tenderness to palpation throughout the abdomen without rebound or guarding.  Vital signs are normal.  Family is concerned about a small rectal lesion noted at the superior  gluteal cleft.  This appears to callus and does not appear to be a sinus tract, abscess, or of infectious origin.  It is not consistent with a decubitus ulcer.  Labs seem consistent with mild dehydration a CBC seems hemoconcentrated and bicarb is minimally decreased to 21.  UA is pending as there is concern for UTI given recent GU procedure.  Abdominal x-ray is pending given the patient's history of constipation.  Patient has been actively drinking multiple refills of Pedialyte and apple juice in the ER.  Patient care transferred to [place] at the end of my shift to follow-up on abdominal x-ray and UA.  Likely, patient will be transferred to home.  If present, she will need to be treated for constipation and/or UTI.  She will follow up with OB/GYN regarding her recent procedures.  Patient presentation, ED course, and plan of care discussed with review of all pertinent labs and imaging. Please see his/her note for further details regarding further ED course and disposition.   Final Clinical Impression(s) / ED Diagnoses Final diagnoses:  Constipation, unspecified constipation type    Rx / DC Orders ED Discharge Orders         Ordered    polyethylene glycol (MIRALAX) 17 g packet  3 times daily     Discontinue  Reprint     04/09/20 0659           Joanne Gavel, PA-C 04/09/20 1517    Palumbo, April, MD 04/09/20 2306

## 2020-04-09 NOTE — ED Notes (Signed)
Patient given Pedialyte and apple juice, consumed  100%

## 2020-04-09 NOTE — ED Notes (Signed)
Patient given apple juice and pedialyte

## 2020-04-09 NOTE — Discharge Instructions (Signed)
Your work-up today showed that you have some significant constipation.  Please take MiraLAX 3 times a day for 1 week, and then 1 scoop once a day thereafter.  Your urine also showed that he may have a UTI.  Please take the antibiotic as prescribed until finished.  Return to the ER if your symptoms worsen.  Please follow-up with your primary care provider within the week.

## 2020-04-11 LAB — URINE CULTURE: Culture: >=100000 — AB

## 2020-04-12 ENCOUNTER — Telehealth: Payer: Self-pay | Admitting: Emergency Medicine

## 2020-04-12 NOTE — Telephone Encounter (Signed)
Post ED Visit - Positive Culture Follow-up  Culture report reviewed by antimicrobial stewardship pharmacist: [facility] Team []  Elenor Quinones, Pharm.D. []  Heide Guile, Pharm.D., BCPS AQ-ID []  Parks Neptune, Pharm.D., BCPS []  Alycia Rossetti, Pharm.D., BCPS []  [place], Pharm.D., BCPS, AAHIVP []  Legrand Como, Pharm.D., BCPS, AAHIVP []  Salome Arnt, PharmD, BCPS []  Johnnette Gourd, PharmD, BCPS []  Hughes Better, PharmD, BCPS []  Leeroy Cha, PharmD []  Laqueta Linden, PharmD, BCPS []  Albertina Parr, PharmD  [facility] Team []  Leodis Sias, PharmD []  Lindell Spar, PharmD []  Royetta Asal, PharmD []  Graylin Shiver, Rph []  Rema Fendt) Glennon Mac, PharmD []  Arlyn Dunning, PharmD []  Netta Cedars, PharmD []  Dia Sitter, PharmD []  Leone Haven, PharmD []  Gretta Arab, PharmD []  Theodis Shove, PharmD []  Peggyann Juba, PharmD [x]  Reuel Boom, PharmD   Positive urine culture Treated with Cephalexin, organism sensitive to the same and no further patient follow-up is required at this time.  Sandi Raveling Gavrielle Streck 04/12/2020, 4:21 PM

## 2020-05-05 ENCOUNTER — Other Ambulatory Visit: Payer: Self-pay | Admitting: Obstetrics and Gynecology

## 2020-05-05 DIAGNOSIS — R1909 Other intra-abdominal and pelvic swelling, mass and lump: Secondary | ICD-10-CM

## 2020-05-14 ENCOUNTER — Ambulatory Visit (HOSPITAL_COMMUNITY)
Admission: RE | Admit: 2020-05-14 | Discharge: 2020-05-14 | Disposition: A | Discharge status: Home / Self Care | Payer: Medicare Other | Source: Ambulatory Visit | Attending: Obstetrics and Gynecology | Admitting: Obstetrics and Gynecology

## 2020-05-14 ENCOUNTER — Other Ambulatory Visit: Payer: Self-pay

## 2020-05-14 ENCOUNTER — Encounter (HOSPITAL_COMMUNITY): Payer: Self-pay

## 2020-05-14 DIAGNOSIS — R1909 Other intra-abdominal and pelvic swelling, mass and lump: Secondary | ICD-10-CM

## 2020-05-14 NOTE — Progress Notes (Signed)
Pt came to [facility] long MRI to attempt MRI of the abd/pelvis. Pt was given premeds prior to attempting scan Pt is unable to preform MRI due to inability to hold still. Pt rolling on her side. Unable to even get pt into bore of scanner. Pt will need to be fully sedated to obtain any MRI on her, with current state. This is only done at [facility] and will need to be scheduled there.  Mom notified who accompanied pt. Message left for Lattie Haw RN @ 12:49pm 05/14/20. Call back number provided on RN message if any questions needed to be asked.

## 2020-05-20 ENCOUNTER — Other Ambulatory Visit (HOSPITAL_COMMUNITY): Payer: Self-pay | Admitting: Obstetrics and Gynecology

## 2020-05-20 DIAGNOSIS — R1909 Other intra-abdominal and pelvic swelling, mass and lump: Secondary | ICD-10-CM

## 2020-05-26 ENCOUNTER — Other Ambulatory Visit (HOSPITAL_COMMUNITY)
Admission: RE | Admit: 2020-05-26 | Discharge: 2020-05-26 | Disposition: A | Discharge status: Home / Self Care | Payer: Medicare Other | Source: Ambulatory Visit | Attending: Obstetrics and Gynecology | Admitting: Obstetrics and Gynecology

## 2020-05-26 DIAGNOSIS — Z01812 Encounter for preprocedural laboratory examination: Secondary | ICD-10-CM | POA: Insufficient documentation

## 2020-05-26 DIAGNOSIS — Z20822 Contact with and (suspected) exposure to covid-19: Secondary | ICD-10-CM | POA: Insufficient documentation

## 2020-05-26 LAB — SARS CORONAVIRUS 2 (TAT 6-24 HRS): SARS Coronavirus 2: NEGATIVE

## 2020-05-27 ENCOUNTER — Other Ambulatory Visit: Payer: Medicare Other

## 2020-05-27 ENCOUNTER — Encounter (HOSPITAL_COMMUNITY): Payer: Self-pay | Admitting: *Deleted

## 2020-05-27 NOTE — Progress Notes (Signed)
Anesthesia Chart Review:  Case: [id number] Date/Time: 05/28/20 0745   Procedure: MRI PELVIS AND ABDOMEN WITH AND WITHOUT CONTRAST (N/A )   Anesthesia type: General   Pre-op diagnosis: ABDOMINAL AND PELVIC MASS; ABDOMINAL PAIN   Location: [place] [facility] [place]   Surgeons: Radiologist, Medication, MD      DISCUSSION:  Patient is a 44 year old female scheduled for MRI abd/pelvis under anesthesia on 05/28/20. MRI was ordered by Carlynn Purl, DO. H&P form was signed on 05/22/20 and is scanned under Media tab, Radiology Order, Encounter 05/28/20. MRI was initially attempted at [facility] with premeds but still unable to get patient into bore of scanner. She is s/p PAP/endometrial biopsy and breast exam under anesthesia on 03/31/20. 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  Neurology records in [place] indicate that prior to moving with her mother to [place], she lived in [place] for about 6 years but had spend most of her life in the [place] areas. Her last known neurology evaluation was by Tedra Coupe, MD [facility] on 11/02/16.  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  At one point she had seizures but were in remission.  She had scoliosis and cleft lip and palate, s/p repair. She had minimal speech  which stopped as a child. She was not able to sit up until around age 78. At the time of that visit, she was able to crawl around and walk with a walker. She was able to eat chopped or purree foods with assistance, but weighed only 56 lbs. Dr. Vallarie Mare did not have records regarding what diagnostic testing had been completed during her childhood. She was noted to have low tone, but was hyperreflexic throughout, "making a peripheral process less likely." He added, "While she is weak and has reduced tone, I don't see specific atrophy and her hyperreflexia also reduces the likelihood of a congenital myopathy/neuropathy syndrome combined with microcephaly and other dysmorphisms. Given the lack of extant records and her mother's very limited understanding of the nature of her previous evalutions and lack of family history, I don't think pursuing a definitive diagnosis is in her interest so recommend supportive care as able. No neurological follow-up is needed."  She was referred to cardiology in 2019 for abnormal EKG (non-specific T wave abnormality with short PR). She was seen at [facility] by Cleophus Molt, MD on 10/05/17. Echo was done as part of pre-operative evaluation prior to eye exam under general anesthesia, and showed normal LVF, RVF, mild TR, no obvious significant valvular abnormalities.   Last visit by urology seen was from 05/16/16 with Tresa Endo, MD [facility] for follow-up 08/05/15 CT showed 10 mm benign left adrenal adenoma, right extrarenal pelvis with dilatation of proximal right ureter to the mid ureter with some narrowing/possible ureteral stricture of unknown chronicity.  and we have no idea whether this has been present previously. She underwent serial renal US x2, last 05/16/16 with mild-moderate right hydronephrosis, suspected stable.  Creatinine 0.72 on 04/09/20.   05/26/2020 preprocedure COVID-19 test negative. Anesthesia team to evaluate on the day of surgery.     VS:   Wt Readings from Last 3 Encounters:  03/31/20 25.4 kg  02/14/20 25.4 kg  02/14/18 25.4 kg   BP Readings from Last 3 Encounters:  04/09/20 96/86  03/31/20 112/79  03/10/20 137/83   Pulse Readings from Last 3 Encounters:  04/09/20 99  03/31/20 79  03/10/20 (!) 102    PROVIDERS: Helane Rima, MD is PCP ([place], see Care Everywhere). Last telemedicine 01/06/20 with Tobie Lords, [place]. - As above, saw cardiologist Cleophus Molt, MD in 2019, urologist Tresa Endo, MD in 2017 and neurologist Caress, Jeneen Rinks, MD in 2018.   LABS: Currently, last lab results include: Lab Results  Component Value Date   WBC 8.0 04/09/2020   HGB 13.1 04/09/2020   HCT 43.5 04/09/2020   PLT 138 (L) 04/09/2020   GLUCOSE 124 (H) 04/09/2020   ALT 25 04/09/2020   AST 30 04/09/2020   NA 141 04/09/2020   K 4.1 04/09/2020   CL 105 04/09/2020   CREATININE 0.72 04/09/2020   BUN 9 04/09/2020   CO2 21 (L) 04/09/2020     IMAGES: 1V abd xray 04/09/20: IMPRESSION: Large amount of stool noted throughout the colon suggesting constipation. No bowel distention.  CXR 02/14/20: FINDINGS: The heart size and mediastinal contours are within normal limits. Stable elevated left hemidiaphragm is noted. Minimal bibasilar subsegmental atelectasis is noted. The visualized skeletal structures are unremarkable. IMPRESSION: Minimal bibasilar subsegmental atelectasis.  CT abd/pelvis 02/14/20: IMPRESSION: 1. 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 Functional degree of obstruction may be assessed by nuclear scintigraphic Lasix renal scan on a nonemergent basis. 2. Large burden of stool throughout the colon. 3. Uterine didelphys. Probable fibroid of the left uterine horn. - ED provider advised follow-up with GYN and  urology  US Renal 05/16/16 [facility]): 1. Mild to moderate right-sided hydroureteronephrosis. The distal ureter is not visualized. The degree of hydronephrosis appears slightly increased from 02/05/2016.  2. Negative for hydronephrosis on the left.  3. Both kidneys measure atrophic in size, although may be congruent with the patient's small stature.   EKG: Last EKG seen if from 10/28/17: Sinus rhythm Ventricular premature complex Aberrant conduction of SV complex(es) Short PR interval Borderline T abnormalities, diffuse leads Prolonged QT interval (QT 478, QTc 608 ms) No old tracing to compare Confirmed by Malvin Johns [phone]) on 10/29/2017 9:22:22 PM   CV: Echo 10/27/2017([facility]; scanned under Media tab, Correspondence, 02/14/18): Summary: 1. Study was technically difficult. 2. LV normal in size, wall thickness and wall motion with EF 60-65%. LV diastolic function normal. 3. RV grossly normal in size and function. 4. Mild 1+ tricuspid regurgitation. 5. Pulmonary hypertension is not suggested by Doppler findings. 6. No obvious significant functional abnormalities of the cardiac valves but with limited visualization of the valvular structure. 7. No pericardial effusion. 8. IVC not well-visualized.   Past Medical History:  Diagnosis Date  . Abnormal uterine bleeding (AUB)   . Bowel incontinence   . 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 CP (cerebral palsy) ([place])   . GERD (gastroesophageal reflux disease)   . Glaucoma   . Hydronephrosis of right kidney   . Hyperlipemia   . Mental retardation    non verbal  .  Muscular dystrophy ([place])   . Pneumonia    x 3  . Pre-diabetes   . Seizures ([place])    6 years none since 1987- no longer on medication , no longer sees a neurologist-   . Sepsis ([place])   . Urine incontinence   . Uterine fibroid   . Wheelchair dependent     Past Surgical History:  Procedure Laterality Date  . CLEFT PALATE REPAIR    . DILATATION  & CURETTAGE/HYSTEROSCOPY WITH MYOSURE N/A 03/31/2020   Procedure: DILATATION & CURETTAGE WITH ENDOMETRIAL BIOPSY;  Surgeon: Sherlyn Hay, DO;  Location: [place];  Service: Gynecology;  Laterality: N/A;  . EYE SURGERY Bilateral    Glucoma -   . MULTIPLE TOOTH EXTRACTIONS    . NASAL SEPTUM SURGERY     lifted up  . OPERATIVE ULTRASOUND N/A 03/31/2020   Procedure: OPERATIVE ULTRASOUND;  Surgeon: Sherlyn Hay, DO;  Location: [place];  Service: Gynecology;  Laterality: N/A;    MEDICATIONS: No current facility-administered medications for this encounter.   Marland Kitchen acetaminophen (TYLENOL) 650 MG CR tablet  . baclofen (LIORESAL) 10 MG tablet  . cholecalciferol (VITAMIN D3) 25 MCG (1000 UNIT) tablet  . latanoprost (XALATAN) 0.005 % ophthalmic solution  . magnesium citrate SOLN  . Multiple Vitamin (MULTIVITAMIN WITH MINERALS) TABS tablet  . oxyCODONE-acetaminophen (PERCOCET/ROXICET) 5-325 MG tablet  . simvastatin (ZOCOR) 40 MG tablet  . dicyclomine (BENTYL) 20 MG tablet  . ibuprofen (ADVIL) 100 MG/5ML suspension  . polyethylene glycol (MIRALAX) 17 g packet  . senna-docusate (SENOKOT-S) 8.6-50 MG tablet  . traMADol (ULTRAM) 50 MG tablet    Myra Gianotti, PA-C Surgical Short Stay/Anesthesiology [facility] Phone [phone] [facility] Phone [phone] 05/27/2020 11:00 AM

## 2020-05-27 NOTE — Progress Notes (Signed)
Spoke with patient's Mother Maria [name] at [phone]  PCP - Tobie Lords, [place] Cardiologist - n/a  Chest x-ray - 02/14/20 (2V) EKG - n/a Stress Test - n/a ECHO - n/a Cardiac Cath - n/a  Anesthesia review: Yes  STOP now taking any Aspirin (unless otherwise instructed by your surgeon), Aleve, Naproxen, Ibuprofen, Motrin, Advil, Goody's, BC's, all herbal medications, fish oil, and all vitamins.   Coronavirus Screening Covid test on 05/26/20 was negative. Mother Maria [name] states patient does not have shortness of breath, fever, cough or chest pain.  Mother Maria [name] verbalized understanding of instructions that were given via phone

## 2020-05-27 NOTE — Anesthesia Preprocedure Evaluation (Addendum)
Anesthesia Evaluation  Patient identified by MRN, date of birth, ID band Patient awake    Reviewed: Allergy & Precautions, NPO status , Patient's Chart, lab work & pertinent test results  History of Anesthesia Complications Negative for: history of anesthetic complications  Airway Mallampati: III  TM Distance: >3 FB Neck ROM: Full  Mouth opening: Limited Mouth Opening  Dental  (+) Edentulous Upper, Edentulous Lower   Pulmonary neg shortness of breath, neg recent URI,  Covid-19 Nucleic Acid Test Results Lab Results      Component                Value               Date                      SARSCOV2NAA              NEGATIVE            05/26/2020                [place]              NEGATIVE            03/27/2020              breath sounds clear to auscultation       Cardiovascular  Rhythm:Regular     Neuro/Psych Seizures -,  Mental delay Neuromuscular disease    GI/Hepatic GERD  ,  Endo/Other    Renal/GU Renal disease     Musculoskeletal   Abdominal   Peds  Hematology Lab Results      Component                Value               Date                      WBC                      8.0                 04/09/2020                HGB                      13.1                04/09/2020                HCT                      43.5                04/09/2020                MCV                      78.7 (L)            04/09/2020                PLT                      138 (L)             04/09/2020  Anesthesia Other Findings Patient is a 44 year old female scheduled for MRI abd/pelvis under anesthesia on 05/28/20. MRI was ordered by Carlynn Purl, DO. H&P form was signed on 05/22/20 and is scanned under Media tab, Radiology Order, Encounter 05/28/20. MRI was initially attempted at [facility] with premeds but still unable to get patient into bore of scanner. She is s/p PAP/endometrial biopsy and breast exam under  anesthesia on 03/31/20. 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  Neurology records in [place] indicate that prior to moving with her mother to [place], she lived in [place] for about 6 years but had spend most of her [place] the [place] areas. Her last known neurology evaluation was by Tedra Coupe, MD [facility]) on 11/02/16.He notes premature birth at 27 months with diagnosis cerebral palsy and intellectual disability. At one point she had seizures but were in remission. She had scoliosis and cleft lip and palate, s/p repair. She had minimal speech which stopped as a child. She was not able to sit up until around age 69. At the time of that visit, she was able to crawl around and walk with a walker. She was able to eat chopped or purree foods with assistance, but weighed only 56 lbs. Dr. Vallarie Mare did not have records regarding what diagnostic testing had been completed during her childhood. She was noted to have low tone, but washyperreflexic throughout, "making a peripheral process less likely." He added, "While she is weak and has reduced tone, I don't see specific atrophy and her hyperreflexia also reduces the likelihood of a congenital myopathy/neuropathy syndrome combined with microcephaly and other dysmorphisms. Given the lack of extant records and her  mother's very limited understanding of the nature of her previous evalutions and lack of family history, I don't think pursuing a definitive diagnosis is in her interest so recommend supportive care as able. No neurological follow-up is needed."  She was referred to cardiology in 2019 for abnormal EKG (non-specific T wave abnormality with short PR). She was seen at [facility] by Cleophus Molt, MD on 10/05/17. Echo was done as part of pre-operative evaluation prior to eye exam under general anesthesia, and showed normal LVF, RVF, mild TR, no obvious significant valvular abnormalities.   Last visit by urology seen was from 05/16/16 with [facility]resa End[facility], [facility] for follow-up 08/05/15 CT showed 10 mm benign left adrenal adenoma, right extrarenal pelvis withdilatation of proximal right ureter to the mid ureter with some narrowing/possible ureteral stricture of unknown chronicity.and we have no idea whether this has been present previously. She underwent serial renal US x2, last 05/16/16 with mild-moderate right hydronephrosis, suspected stable.Creatinine 0.72 on 04/09/20.  Reproductive/Obstetrics                            Anesthesia Physical Anesthesia Plan  ASA: III  Anesthesia Plan: General   Post-op Pain Management:    Induction: Inhalational  PONV Risk Score and Plan: 3 and Ondansetron, Dexamethasone and Propofol infusion  Airway Management Planned:  Oral ETT  Additional Equipment: None  Intra-op Plan:   Post-operative Plan: Extubation in OR  Informed Consent:     Dental advisory given and Consent reviewed with POA  Plan Discussed with: CRNA  Anesthesia Plan Comments: (PAT note written 05/27/2020 by Myra Gianotti, PA-C. )       Anesthesia Quick Evaluation

## 2020-05-28 ENCOUNTER — Encounter (HOSPITAL_COMMUNITY): Payer: Self-pay

## 2020-05-28 ENCOUNTER — Ambulatory Visit (HOSPITAL_COMMUNITY): Payer: Medicare Other | Admitting: Vascular Surgery

## 2020-05-28 ENCOUNTER — Other Ambulatory Visit: Payer: Self-pay

## 2020-05-28 ENCOUNTER — Ambulatory Visit (HOSPITAL_COMMUNITY)
Admission: RE | Admit: 2020-05-28 | Discharge: 2020-05-28 | Disposition: A | Discharge status: Home / Self Care | Payer: Medicare Other | Source: Ambulatory Visit | Attending: Obstetrics and Gynecology | Admitting: Obstetrics and Gynecology

## 2020-05-28 ENCOUNTER — Ambulatory Visit (HOSPITAL_COMMUNITY): Admission: RE | Admit: 2020-05-28 | Payer: Medicare Other | Source: Ambulatory Visit

## 2020-05-28 ENCOUNTER — Ambulatory Visit (HOSPITAL_COMMUNITY)
Admission: AD | Admit: 2020-05-28 | Discharge: 2020-05-28 | Disposition: A | Discharge status: Home / Self Care | Payer: Medicare Other | Attending: Obstetrics and Gynecology | Admitting: Obstetrics and Gynecology

## 2020-05-28 ENCOUNTER — Encounter (HOSPITAL_COMMUNITY): Admission: AD | Disposition: A | Discharge status: Home / Self Care | Payer: Self-pay | Source: Home / Self Care

## 2020-05-28 DIAGNOSIS — R109 Unspecified abdominal pain: Secondary | ICD-10-CM | POA: Diagnosis not present

## 2020-05-28 DIAGNOSIS — R7303 Prediabetes: Secondary | ICD-10-CM | POA: Insufficient documentation

## 2020-05-28 DIAGNOSIS — N1330 Unspecified hydronephrosis: Secondary | ICD-10-CM | POA: Diagnosis not present

## 2020-05-28 DIAGNOSIS — D261 Other benign neoplasm of corpus uteri: Secondary | ICD-10-CM | POA: Insufficient documentation

## 2020-05-28 DIAGNOSIS — G809 Cerebral palsy, unspecified: Secondary | ICD-10-CM | POA: Diagnosis not present

## 2020-05-28 DIAGNOSIS — R1909 Other intra-abdominal and pelvic swelling, mass and lump: Secondary | ICD-10-CM

## 2020-05-28 DIAGNOSIS — F79 Unspecified intellectual disabilities: Secondary | ICD-10-CM | POA: Insufficient documentation

## 2020-05-28 DIAGNOSIS — Z993 Dependence on wheelchair: Secondary | ICD-10-CM | POA: Diagnosis not present

## 2020-05-28 HISTORY — DX: Cleft lip, unilateral: Q36.9

## 2020-05-28 HISTORY — DX: Dependence on wheelchair: Z99.3

## 2020-05-28 HISTORY — PX: RADIOLOGY WITH ANESTHESIA: SHX6223

## 2020-05-28 SURGERY — MRI WITH ANESTHESIA
Anesthesia: General

## 2020-05-28 MED ORDER — DEXAMETHASONE SODIUM PHOSPHATE 10 MG/ML IJ SOLN
INTRAMUSCULAR | Status: DC | PRN
  Administered 2020-05-28: 2 mg via INTRAVENOUS

## 2020-05-28 MED ORDER — LACTATED RINGERS IV SOLN: INTRAVENOUS | Status: DC

## 2020-05-28 MED ORDER — SUGAMMADEX SODIUM 200 MG/2ML IV SOLN
INTRAVENOUS | Status: DC | PRN
  Administered 2020-05-28: 100 mg via INTRAVENOUS

## 2020-05-28 MED ORDER — MIDAZOLAM HCL 2 MG/ML PO SYRP: 10.0000 mg | ORAL_SOLUTION | Freq: Once | ORAL | Status: AC

## 2020-05-28 MED ORDER — PROPOFOL 10 MG/ML IV BOLUS
INTRAVENOUS | Status: DC | PRN
  Administered 2020-05-28: 50 mg via INTRAVENOUS

## 2020-05-28 MED ORDER — ORAL CARE MOUTH RINSE: 15.0000 mL | Freq: Once | OROMUCOSAL | Status: DC

## 2020-05-28 MED ORDER — MIDAZOLAM HCL 2 MG/ML PO SYRP
ORAL_SOLUTION | ORAL | Status: AC
  Administered 2020-05-28: 10 mg via ORAL
  Filled 2020-05-28: qty 6

## 2020-05-28 MED ORDER — ROCURONIUM BROMIDE 10 MG/ML (PF) SYRINGE
PREFILLED_SYRINGE | INTRAVENOUS | Status: DC | PRN
  Administered 2020-05-28 (×2): 20 mg via INTRAVENOUS

## 2020-05-28 MED ORDER — CHLORHEXIDINE GLUCONATE 0.12 % MT SOLN: 15.0000 mL | Freq: Once | OROMUCOSAL | Status: DC

## 2020-05-28 MED ORDER — GADOBUTROL 1 MMOL/ML IV SOLN
2.6000 mL | Freq: Once | INTRAVENOUS | Status: AC | PRN
  Administered 2020-05-28: 2.6 mL via INTRAVENOUS

## 2020-05-28 MED ORDER — PROPOFOL 500 MG/50ML IV EMUL
INTRAVENOUS | Status: DC | PRN
  Administered 2020-05-28: 50 ug/kg/min via INTRAVENOUS

## 2020-05-28 MED ORDER — ONDANSETRON HCL 4 MG/2ML IJ SOLN
INTRAMUSCULAR | Status: DC | PRN
  Administered 2020-05-28: 2 mg via INTRAVENOUS

## 2020-05-28 NOTE — Transfer of Care (Signed)
Immediate Anesthesia Transfer of Care Note  Patient: Maria [name]  Procedure(s) Performed: MRI PELVIS AND ABDOMEN WITH AND WITHOUT CONTRAST (N/A )  Patient Location: PACU  Anesthesia Type:General  Level of Consciousness: drowsy  Airway & Oxygen Therapy: Patient Spontanous Breathing  Post-op Assessment: Report given to RN and Post -op Vital signs reviewed and stable  Post vital signs: Reviewed and stable  Last Vitals:  Vitals Value Taken Time  BP 142/87 05/28/20 1031  Temp 36.4 C 05/28/20 1030  Pulse 98 05/28/20 1036  Resp 16 05/28/20 1038  SpO2 100 % 05/28/20 1036  Vitals shown include unvalidated device data.  Last Pain:  Vitals:   05/28/20 0622  TempSrc: Axillary         Complications: No complications documented.

## 2020-05-28 NOTE — Anesthesia Procedure Notes (Signed)
Procedure Name: Intubation Date/Time: 05/28/2020 8:29 AM Performed by: Imagene Riches, CRNA Pre-anesthesia Checklist: Patient identified, Emergency Drugs available, Suction available and Patient being monitored Patient Re-evaluated:Patient Re-evaluated prior to induction Oxygen Delivery Method: Circle system utilized Preoxygenation: Pre-oxygenation with 100% oxygen Induction Type: IV induction Ventilation: Mask ventilation without difficulty Laryngoscope Size: Miller and 2 Grade View: Grade I Tube type: Oral Tube size: 5.0 mm Number of attempts: 1 Airway Equipment and Method: Stylet and Oral airway Placement Confirmation: ETT inserted through vocal cords under direct vision,  positive ETCO2 and breath sounds checked- equal and bilateral Secured at: 16 cm Tube secured with: Tape Dental Injury: Teeth and Oropharynx as per pre-operative assessment

## 2020-05-28 NOTE — Progress Notes (Signed)
Verbal order for 10mg  oral versed given by Dr. Ermalene Postin. Per MD no need for labs.  10mg  oral versed given [phone].

## 2020-05-29 ENCOUNTER — Encounter (HOSPITAL_COMMUNITY): Payer: Self-pay | Admitting: Radiology

## 2020-06-01 NOTE — Anesthesia Postprocedure Evaluation (Signed)
Anesthesia Post Note  Patient: Quintina Hakeem  Procedure(s) Performed: MRI PELVIS AND ABDOMEN WITH AND WITHOUT CONTRAST (N/A )     Patient location during evaluation: PACU Anesthesia Type: General Level of consciousness: awake and alert Pain management: pain level controlled Vital Signs Assessment: post-procedure vital signs reviewed and stable Respiratory status: spontaneous breathing, nonlabored ventilation, respiratory function stable and patient connected to nasal cannula oxygen Cardiovascular status: blood pressure returned to baseline and stable Postop Assessment: no apparent nausea or vomiting Anesthetic complications: no   No complications documented.  Last Vitals:  Vitals:   05/28/20 1045 05/28/20 1100  BP: 112/81   Pulse: 97 90  Resp: 12 15  Temp:  36.6 C  SpO2: 97% 97%    Last Pain:  Vitals:   05/28/20 0622  TempSrc: Axillary                 Dominico Rod

## 2020-06-18 ENCOUNTER — Other Ambulatory Visit: Payer: Self-pay | Admitting: Urology

## 2020-09-04 ENCOUNTER — Other Ambulatory Visit (HOSPITAL_COMMUNITY)
Admission: RE | Admit: 2020-09-04 | Discharge: 2020-09-04 | Disposition: A | Discharge status: Home / Self Care | Payer: Medicare Other | Source: Ambulatory Visit | Attending: Obstetrics and Gynecology | Admitting: Obstetrics and Gynecology

## 2020-09-04 DIAGNOSIS — Z20822 Contact with and (suspected) exposure to covid-19: Secondary | ICD-10-CM | POA: Insufficient documentation

## 2020-09-04 DIAGNOSIS — Z01812 Encounter for preprocedural laboratory examination: Secondary | ICD-10-CM | POA: Diagnosis present

## 2020-09-05 LAB — SARS CORONAVIRUS 2 (TAT 6-24 HRS): SARS Coronavirus 2: NEGATIVE

## 2020-09-07 ENCOUNTER — Encounter (HOSPITAL_COMMUNITY): Payer: Self-pay | Admitting: Obstetrics and Gynecology

## 2020-09-07 NOTE — Anesthesia Preprocedure Evaluation (Addendum)
Anesthesia Evaluation  Patient identified by MRN, date of birth, ID band Patient awake    Reviewed: Allergy & Precautions, NPO status , Patient's Chart, lab work & pertinent test results  History of Anesthesia Complications Negative for: history of anesthetic complications  Airway Mallampati: III  TM Distance: >3 FB Neck ROM: Full  Mouth opening: Limited Mouth Opening  Dental  (+) Edentulous Upper, Edentulous Lower   Pulmonary neg shortness of breath, neg recent URI,    breath sounds clear to auscultation       Cardiovascular  Rhythm:Regular     Neuro/Psych Seizures -,  Mental delay Neuromuscular disease    GI/Hepatic GERD  ,  Endo/Other    Renal/GU Renal disease     Musculoskeletal   Abdominal   Peds  (+) Neurological problem Hematology  (+) anemia ,   Anesthesia Other Findings Patient is a 44 year old female scheduled for above. She is s/p PAP/endometrial biopsy and breast exam under anesthesia on 03/31/20. 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  Neurology records in [place] indicate that prior to moving with her mother to [place], she lived in [place] for about 6 years but had spend most of her [place]. Her last known neurology evaluation was by Tedra Coupe,  MD [facility] on 11/02/16.He notes premature birth at 67 months with diagnosis cerebral palsy and intellectual disability. At one point she had seizures but were in remission. She had scoliosis and cleft lip and palate, s/p repair. She had minimal speech which stopped as a child. She was not able to sit up until around age 89. At the time of that visit, she was able to crawl around and walk with a walker. She was able to eat chopped or purree foods with assistance, but weighed only 56 lbs. Dr. Vallarie Mare did not have records regarding what diagnostic testing had been completed during her childhood. She was noted to have low tone, but washyperreflexic throughout, "making a peripheral process less likely." He added, "While she is weak and has reduced tone, I don't see specific atrophy and her hyperreflexia also reduces the likelihood of a congenital myopathy/neuropathy syndrome combined with microcephaly and other dysmorphisms. Given the lack of extant records and her mother's very limited understanding of the nature of her previous evalutions and lack of family history, I don't think pursuing a definitive diagnosis is in her interest so recommend supportive care as able. No neurological follow-up is needed."  She was referred to cardiology in 2019 for abnormal EKG (non-specific T wave abnormality with short PR). She was seen at [facility] by Cleophus Molt, MD on 10/05/17. Echo was done as part of pre-operative evaluation prior to eye exam under general anesthesia, and showed normal LVF, RVF, mild TR, no obvious significant valvular abnormalities.   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 She underwent serial renal US x2, last 05/16/16 with mild-moderate right hydronephrosis, suspected stable.Creatinine 0.72 on 04/09/20.  Reproductive/Obstetrics                            Anesthesia Physical  Anesthesia Plan  ASA: III  Anesthesia Plan: General   Post-op Pain Management:    Induction: Inhalational  PONV Risk Score and Plan: 3 and Ondansetron, Dexamethasone and Propofol infusion  Airway Management Planned: Oral ETT  Additional Equipment: None  Intra-op Plan:   Post-operative Plan: Extubation in OR  Informed Consent:     Dental advisory given and Consent reviewed with POA  Plan Discussed with: CRNA  Anesthesia Plan Comments: ( )        Anesthesia Quick Evaluation

## 2020-09-07 NOTE — Progress Notes (Signed)
Spoke with pt's mother, [name] for pre-op call. Pt has Cerebral palsy, is non verbal. Pt's mother or her sister will be with pt tomorrow.   Covid test done on 09/04/20. Pt has been in quarantine per mom and will continue until she comes to the hospital tomorrow.

## 2020-09-08 ENCOUNTER — Inpatient Hospital Stay (HOSPITAL_COMMUNITY): Payer: Medicare Other | Admitting: Certified Registered Nurse Anesthetist

## 2020-09-08 ENCOUNTER — Inpatient Hospital Stay (HOSPITAL_COMMUNITY): Payer: Medicare Other

## 2020-09-08 ENCOUNTER — Encounter (HOSPITAL_COMMUNITY)
Admission: RE | Disposition: A | Discharge status: Home / Self Care | Payer: Self-pay | Source: Home / Self Care | Attending: Obstetrics and Gynecology

## 2020-09-08 ENCOUNTER — Inpatient Hospital Stay (HOSPITAL_COMMUNITY)
Admission: RE | Admit: 2020-09-08 | Discharge: 2020-09-10 | DRG: 742 | Disposition: A | Discharge status: Home / Self Care | Payer: Medicare Other | Attending: Obstetrics and Gynecology | Admitting: Obstetrics and Gynecology

## 2020-09-08 ENCOUNTER — Other Ambulatory Visit: Payer: Self-pay

## 2020-09-08 ENCOUNTER — Encounter (HOSPITAL_COMMUNITY): Payer: Self-pay | Admitting: Obstetrics and Gynecology

## 2020-09-08 DIAGNOSIS — G7100 Muscular dystrophy, unspecified: Secondary | ICD-10-CM | POA: Diagnosis present

## 2020-09-08 DIAGNOSIS — H409 Unspecified glaucoma: Secondary | ICD-10-CM | POA: Diagnosis present

## 2020-09-08 DIAGNOSIS — Q5128 Other doubling of uterus, other specified: Secondary | ICD-10-CM | POA: Diagnosis not present

## 2020-09-08 DIAGNOSIS — K219 Gastro-esophageal reflux disease without esophagitis: Secondary | ICD-10-CM | POA: Diagnosis present

## 2020-09-08 DIAGNOSIS — Q51820 Cervical duplication: Secondary | ICD-10-CM | POA: Diagnosis not present

## 2020-09-08 DIAGNOSIS — Z993 Dependence on wheelchair: Secondary | ICD-10-CM | POA: Diagnosis not present

## 2020-09-08 DIAGNOSIS — F79 Unspecified intellectual disabilities: Secondary | ICD-10-CM | POA: Diagnosis present

## 2020-09-08 DIAGNOSIS — N920 Excessive and frequent menstruation with regular cycle: Secondary | ICD-10-CM | POA: Diagnosis present

## 2020-09-08 DIAGNOSIS — Z20822 Contact with and (suspected) exposure to covid-19: Secondary | ICD-10-CM | POA: Diagnosis present

## 2020-09-08 DIAGNOSIS — N840 Polyp of corpus uteri: Secondary | ICD-10-CM | POA: Diagnosis present

## 2020-09-08 DIAGNOSIS — N1330 Unspecified hydronephrosis: Secondary | ICD-10-CM | POA: Diagnosis present

## 2020-09-08 DIAGNOSIS — E785 Hyperlipidemia, unspecified: Secondary | ICD-10-CM | POA: Diagnosis present

## 2020-09-08 DIAGNOSIS — N945 Secondary dysmenorrhea: Principal | ICD-10-CM | POA: Diagnosis present

## 2020-09-08 DIAGNOSIS — Z83511 Family history of glaucoma: Secondary | ICD-10-CM

## 2020-09-08 DIAGNOSIS — G809 Cerebral palsy, unspecified: Secondary | ICD-10-CM | POA: Diagnosis present

## 2020-09-08 DIAGNOSIS — Z419 Encounter for procedure for purposes other than remedying health state, unspecified: Secondary | ICD-10-CM

## 2020-09-08 HISTORY — DX: Anemia, unspecified: D64.9

## 2020-09-08 HISTORY — PX: CYSTOSCOPY WITH RETROGRADE PYELOGRAM, URETEROSCOPY AND STENT PLACEMENT: SHX5789

## 2020-09-08 HISTORY — PX: ABDOMINAL HYSTERECTOMY: SHX81

## 2020-09-08 HISTORY — PX: STENT REMOVAL: SHX6421

## 2020-09-08 HISTORY — PX: HYSTERECTOMY ABDOMINAL WITH SALPINGECTOMY: SHX6725

## 2020-09-08 LAB — TYPE AND SCREEN
ABO/RH(D): A POS
Antibody Screen: NEGATIVE

## 2020-09-08 LAB — CBC
HCT: 40.6 % (ref 36.0–46.0)
Hemoglobin: 12.9 g/dL (ref 12.0–15.0)
MCH: 25 pg — ABNORMAL LOW (ref 26.0–34.0)
MCHC: 31.8 g/dL (ref 30.0–36.0)
MCV: 78.8 fL — ABNORMAL LOW (ref 80.0–100.0)
Platelets: 287 10*3/uL (ref 150–400)
RBC: 5.15 MIL/uL — ABNORMAL HIGH (ref 3.87–5.11)
RDW: 15 % (ref 11.5–15.5)
WBC: 19.1 10*3/uL — ABNORMAL HIGH (ref 4.0–10.5)
nRBC: 0 % (ref 0.0–0.2)

## 2020-09-08 LAB — POCT I-STAT, CHEM 8
BUN: 6 mg/dL (ref 6–20)
Calcium, Ion: 1.28 mmol/L (ref 1.15–1.40)
Chloride: 102 mmol/L (ref 98–111)
Creatinine, Ser: 0.8 mg/dL (ref 0.44–1.00)
Glucose, Bld: 113 mg/dL — ABNORMAL HIGH (ref 70–99)
HCT: 43 % (ref 36.0–46.0)
Hemoglobin: 14.6 g/dL (ref 12.0–15.0)
Potassium: 4.1 mmol/L (ref 3.5–5.1)
Sodium: 140 mmol/L (ref 135–145)
TCO2: 28 mmol/L (ref 22–32)

## 2020-09-08 LAB — CREATININE, SERUM
Creatinine, Ser: 1.08 mg/dL — ABNORMAL HIGH (ref 0.44–1.00)
GFR, Estimated: >60 mL/min (ref >60)

## 2020-09-08 LAB — ABO/RH: ABO/RH(D): A POS

## 2020-09-08 SURGERY — HYSTERECTOMY, TOTAL, ABDOMINAL, WITH SALPINGECTOMY
Anesthesia: General | Site: Uterus

## 2020-09-08 MED ORDER — CEFAZOLIN SODIUM-DEXTROSE 1-4 GM/50ML-% IV SOLN
INTRAVENOUS | Status: DC | PRN
  Administered 2020-09-08: 1 g via INTRAVENOUS

## 2020-09-08 MED ORDER — SIMVASTATIN 20 MG PO TABS
40.0000 mg | ORAL_TABLET | Freq: Every day | ORAL | Status: DC
  Administered 2020-09-08 – 2020-09-10 (×3): 40 mg via ORAL
  Filled 2020-09-08 (×3): qty 2

## 2020-09-08 MED ORDER — DEXAMETHASONE SODIUM PHOSPHATE 10 MG/ML IJ SOLN
INTRAMUSCULAR | Status: AC
  Filled 2020-09-08: qty 1

## 2020-09-08 MED ORDER — ONDANSETRON HCL 4 MG/2ML IJ SOLN: 4.0000 mg | Freq: Four times a day (QID) | INTRAMUSCULAR | Status: DC | PRN

## 2020-09-08 MED ORDER — IOHEXOL 300 MG/ML  SOLN
INTRAMUSCULAR | Status: DC | PRN
  Administered 2020-09-08: 14 mL

## 2020-09-08 MED ORDER — ONDANSETRON HCL 4 MG PO TABS: 4.0000 mg | ORAL_TABLET | Freq: Four times a day (QID) | ORAL | Status: DC | PRN

## 2020-09-08 MED ORDER — ACETAMINOPHEN 10 MG/ML IV SOLN
INTRAVENOUS | Status: AC
  Filled 2020-09-08: qty 100

## 2020-09-08 MED ORDER — MENTHOL 3 MG MT LOZG: 1.0000 | LOZENGE | OROMUCOSAL | Status: DC | PRN

## 2020-09-08 MED ORDER — ACETAMINOPHEN 160 MG/5ML PO SOLN
650.0000 mg | Freq: Four times a day (QID) | ORAL | Status: DC | PRN
  Administered 2020-09-08 – 2020-09-09 (×2): 650 mg via ORAL
  Filled 2020-09-08 (×2): qty 20.3

## 2020-09-08 MED ORDER — LACTATED RINGERS IV SOLN: INTRAVENOUS | Status: DC

## 2020-09-08 MED ORDER — IBUPROFEN 100 MG/5ML PO SUSP: 600.0000 mg | Freq: Four times a day (QID) | ORAL | Status: DC

## 2020-09-08 MED ORDER — KETOROLAC TROMETHAMINE 30 MG/ML IJ SOLN
INTRAMUSCULAR | Status: DC | PRN
  Administered 2020-09-08: 10 mg via INTRAVENOUS

## 2020-09-08 MED ORDER — ROCURONIUM BROMIDE 10 MG/ML (PF) SYRINGE
PREFILLED_SYRINGE | INTRAVENOUS | Status: AC
  Filled 2020-09-08: qty 10

## 2020-09-08 MED ORDER — BUPIVACAINE HCL (PF) 0.25 % IJ SOLN
INTRAMUSCULAR | Status: AC
  Filled 2020-09-08: qty 30

## 2020-09-08 MED ORDER — BUPIVACAINE HCL (PF) 0.25 % IJ SOLN
INTRAMUSCULAR | Status: DC | PRN
  Administered 2020-09-08: 10 mL

## 2020-09-08 MED ORDER — PROPOFOL 10 MG/ML IV BOLUS
INTRAVENOUS | Status: AC
  Filled 2020-09-08: qty 20

## 2020-09-08 MED ORDER — HEPARIN SODIUM (PORCINE) 5000 UNIT/ML IJ SOLN
5000.0000 [IU] | Freq: Three times a day (TID) | INTRAMUSCULAR | Status: DC
  Administered 2020-09-08 – 2020-09-10 (×6): 5000 [IU] via SUBCUTANEOUS
  Filled 2020-09-08 (×7): qty 1

## 2020-09-08 MED ORDER — ROCURONIUM BROMIDE 10 MG/ML (PF) SYRINGE
PREFILLED_SYRINGE | INTRAVENOUS | Status: DC | PRN
  Administered 2020-09-08: 20 mg via INTRAVENOUS

## 2020-09-08 MED ORDER — GABAPENTIN 300 MG PO CAPS: 300.0000 mg | ORAL_CAPSULE | ORAL | Status: DC

## 2020-09-08 MED ORDER — ALUM & MAG HYDROXIDE-SIMETH 200-200-20 MG/5ML PO SUSP: 30.0000 mL | ORAL | Status: DC | PRN

## 2020-09-08 MED ORDER — FENTANYL CITRATE (PF) 100 MCG/2ML IJ SOLN
INTRAMUSCULAR | Status: DC | PRN
  Administered 2020-09-08 (×3): 25 ug via INTRAVENOUS

## 2020-09-08 MED ORDER — ONDANSETRON HCL 4 MG/2ML IJ SOLN
INTRAMUSCULAR | Status: DC | PRN
  Administered 2020-09-08: 2.5 mg via INTRAVENOUS

## 2020-09-08 MED ORDER — OXYCODONE HCL 5 MG/5ML PO SOLN
5.0000 mg | ORAL | Status: DC | PRN
  Administered 2020-09-08 – 2020-09-10 (×8): 5 mg via ORAL
  Filled 2020-09-08 (×8): qty 5

## 2020-09-08 MED ORDER — ONDANSETRON HCL 4 MG/2ML IJ SOLN
INTRAMUSCULAR | Status: AC
  Filled 2020-09-08: qty 2

## 2020-09-08 MED ORDER — CHLORHEXIDINE GLUCONATE 0.12 % MT SOLN
15.0000 mL | Freq: Once | OROMUCOSAL | Status: AC
  Filled 2020-09-08: qty 15

## 2020-09-08 MED ORDER — DICYCLOMINE HCL 20 MG PO TABS
10.0000 mg | ORAL_TABLET | Freq: Two times a day (BID) | ORAL | Status: DC | PRN
  Filled 2020-09-08: qty 1

## 2020-09-08 MED ORDER — MAGNESIUM HYDROXIDE 400 MG/5ML PO SUSP: 30.0000 mL | Freq: Every day | ORAL | Status: DC | PRN

## 2020-09-08 MED ORDER — POVIDONE-IODINE 10 % EX SWAB: 2.0000 "application " | Freq: Once | CUTANEOUS | Status: DC

## 2020-09-08 MED ORDER — SODIUM CHLORIDE 0.9 % IR SOLN
Status: DC | PRN
  Administered 2020-09-08: 3000 mL via INTRAVESICAL

## 2020-09-08 MED ORDER — CEFAZOLIN SODIUM-DEXTROSE 2-4 GM/100ML-% IV SOLN
2.0000 g | INTRAVENOUS | Status: DC
  Filled 2020-09-08: qty 100

## 2020-09-08 MED ORDER — KETOROLAC TROMETHAMINE 30 MG/ML IJ SOLN: 30.0000 mg | Freq: Once | INTRAMUSCULAR | Status: DC

## 2020-09-08 MED ORDER — ACETAMINOPHEN 10 MG/ML IV SOLN
INTRAVENOUS | Status: DC | PRN
  Administered 2020-09-08: 375 mg via INTRAVENOUS

## 2020-09-08 MED ORDER — KETOROLAC TROMETHAMINE 30 MG/ML IJ SOLN
INTRAMUSCULAR | Status: AC
  Filled 2020-09-08: qty 1

## 2020-09-08 MED ORDER — SUGAMMADEX SODIUM 200 MG/2ML IV SOLN
INTRAVENOUS | Status: DC | PRN
  Administered 2020-09-08: 50 mg via INTRAVENOUS

## 2020-09-08 MED ORDER — 0.9 % SODIUM CHLORIDE (POUR BTL) OPTIME
TOPICAL | Status: DC | PRN
  Administered 2020-09-08 (×3): 1000 mL

## 2020-09-08 MED ORDER — ACETAMINOPHEN 500 MG PO TABS: 1000.0000 mg | ORAL_TABLET | ORAL | Status: DC

## 2020-09-08 MED ORDER — LATANOPROST 0.005 % OP SOLN
1.0000 [drp] | Freq: Every day | OPHTHALMIC | Status: DC
  Administered 2020-09-08 – 2020-09-09 (×2): 1 [drp] via OPHTHALMIC
  Filled 2020-09-08: qty 2.5

## 2020-09-08 MED ORDER — FENTANYL CITRATE (PF) 250 MCG/5ML IJ SOLN
INTRAMUSCULAR | Status: AC
  Filled 2020-09-08: qty 5

## 2020-09-08 MED ORDER — POLYETHYLENE GLYCOL 3350 17 G PO PACK
17.0000 g | PACK | Freq: Every day | ORAL | Status: DC
  Administered 2020-09-08 – 2020-09-10 (×3): 17 g via ORAL
  Filled 2020-09-08 (×3): qty 1

## 2020-09-08 MED ORDER — IBUPROFEN 100 MG/5ML PO SUSP
600.0000 mg | Freq: Four times a day (QID) | ORAL | Status: DC | PRN
  Administered 2020-09-08 – 2020-09-10 (×3): 600 mg via ORAL
  Filled 2020-09-08 (×3): qty 30

## 2020-09-08 MED ORDER — ORAL CARE MOUTH RINSE
15.0000 mL | Freq: Once | OROMUCOSAL | Status: AC
  Administered 2020-09-08: 15 mL via OROMUCOSAL

## 2020-09-08 MED ORDER — DEXAMETHASONE SODIUM PHOSPHATE 4 MG/ML IJ SOLN
INTRAMUSCULAR | Status: DC | PRN
  Administered 2020-09-08: 2.5 mg via INTRAVENOUS

## 2020-09-08 SURGICAL SUPPLY — 70 items
ADAPTER GOLDBERG URETERAL (ADAPTER) ×4 IMPLANT
BAG URO CATCHER STRL LF (MISCELLANEOUS) ×4 IMPLANT
BARRIER ADHS 3X4 INTERCEED (GAUZE/BANDAGES/DRESSINGS) IMPLANT
BASKET LASER NITINOL 1.9FR (BASKET) IMPLANT
BASKET STNLS GEMINI 4WIRE 3FR (BASKET) IMPLANT
BASKET ZERO TIP NITINOL 2.4FR (BASKET) IMPLANT
CANISTER SUCT 3000ML PPV (MISCELLANEOUS) IMPLANT
CATH INTERMIT  6FR 70CM (CATHETERS) ×4 IMPLANT
CATH URET 5FR 28IN CONE TIP (BALLOONS)
CATH URET 5FR 28IN OPEN ENDED (CATHETERS) IMPLANT
CATH URET 5FR 70CM CONE TIP (BALLOONS) IMPLANT
CATH URET DUAL LUMEN 6-10FR 50 (CATHETERS) IMPLANT
COVER WAND RF STERILE (DRAPES) ×4 IMPLANT
DECANTER SPIKE VIAL GLASS SM (MISCELLANEOUS) ×4 IMPLANT
DERMABOND ADVANCED (GAUZE/BANDAGES/DRESSINGS)
DERMABOND ADVANCED .7 DNX12 (GAUZE/BANDAGES/DRESSINGS) IMPLANT
DRAPE CESAREAN BIRTH W POUCH (DRAPES) ×4 IMPLANT
DRAPE WARM FLUID 44X44 (DRAPES) IMPLANT
DRSG OPSITE POSTOP 4X6 (GAUZE/BANDAGES/DRESSINGS) ×4 IMPLANT
DURAPREP 26ML APPLICATOR (WOUND CARE) ×4 IMPLANT
ELECT REM PT RETURN 9FT ADLT (ELECTROSURGICAL) ×4
ELECTRODE REM PT RTRN 9FT ADLT (ELECTROSURGICAL) ×3 IMPLANT
GAUZE 4X4 16PLY RFD (DISPOSABLE) IMPLANT
GLOVE BIO SURGEON STRL SZ 6.5 (GLOVE) ×4 IMPLANT
GLOVE BIO SURGEON STRL SZ7.5 (GLOVE) ×4 IMPLANT
GLOVE BIOGEL PI IND STRL 7.0 (GLOVE) ×9 IMPLANT
GLOVE BIOGEL PI INDICATOR 7.0 (GLOVE) ×3
GOWN STRL REUS W/ TWL LRG LVL3 (GOWN DISPOSABLE) ×18 IMPLANT
GOWN STRL REUS W/ TWL XL LVL3 (GOWN DISPOSABLE) ×3 IMPLANT
GOWN STRL REUS W/TWL LRG LVL3 (GOWN DISPOSABLE) ×6
GOWN STRL REUS W/TWL XL LVL3 (GOWN DISPOSABLE) ×1
GUIDEWIRE ANG ZIPWIRE 038X150 (WIRE) IMPLANT
GUIDEWIRE STR DUAL SENSOR (WIRE) IMPLANT
GUIDEWIRE ZIPWRE .038 STRAIGHT (WIRE) ×4 IMPLANT
HIBICLENS CHG 4% 4OZ BTL (MISCELLANEOUS) IMPLANT
IV NS IRRIG 3000ML ARTHROMATIC (IV SOLUTION) ×4 IMPLANT
KIT BALLN UROMAX 15FX4 (MISCELLANEOUS) IMPLANT
KIT BALLN UROMAX 26 75X4 (MISCELLANEOUS)
KIT TURNOVER KIT B (KITS) ×8 IMPLANT
MANIFOLD NEPTUNE II (INSTRUMENTS) ×4 IMPLANT
NEEDLE HYPO 22GX1.5 SAFETY (NEEDLE) ×4 IMPLANT
NS IRRIG 1000ML POUR BTL (IV SOLUTION) ×8 IMPLANT
PACK ABDOMINAL GYN (CUSTOM PROCEDURE TRAY) ×4 IMPLANT
PACK CYSTO (CUSTOM PROCEDURE TRAY) ×4 IMPLANT
PAD ARMBOARD 7.5X6 YLW CONV (MISCELLANEOUS) ×8 IMPLANT
PAD OB MATERNITY 4.3X12.25 (PERSONAL CARE ITEMS) ×4 IMPLANT
RTRCTR C-SECT PINK 25CM LRG (MISCELLANEOUS) IMPLANT
RTRCTR WOUND ALEXIS 18CM SML (INSTRUMENTS) ×4
SAVER CELL AAL HAEMONETICS (INSTRUMENTS) ×3 IMPLANT
SET IRRIG Y TYPE TUR BLADDER L (SET/KITS/TRAYS/PACK) IMPLANT
SHEARS FOC LG CVD HARMONIC 17C (MISCELLANEOUS) IMPLANT
SHEATH URETERAL 12FRX35CM (MISCELLANEOUS) IMPLANT
SOL PREP POV-IOD 4OZ 10% (MISCELLANEOUS) ×4 IMPLANT
SPONGE LAP 18X18 RF (DISPOSABLE) IMPLANT
STAPLER VISISTAT 35W (STAPLE) IMPLANT
STRIP CLOSURE SKIN 1/4X3 (GAUZE/BANDAGES/DRESSINGS) ×4 IMPLANT
SUT PDS AB 0 CTX 60 (SUTURE) IMPLANT
SUT SILK 2 0 TIES 17X18 (SUTURE) ×1
SUT SILK 2-0 18XBRD TIE BLK (SUTURE) ×3 IMPLANT
SUT VIC AB 0 CT1 18XCR BRD8 (SUTURE) ×6 IMPLANT
SUT VIC AB 0 CT1 36 (SUTURE) ×4 IMPLANT
SUT VIC AB 0 CT1 8-18 (SUTURE) ×2
SUT VIC AB 2-0 CTB1 (SUTURE) ×4 IMPLANT
SUT VIC AB 4-0 KS 27 (SUTURE) ×4 IMPLANT
SUT VICRYL 0 TIES 12 18 (SUTURE) IMPLANT
SYR CONTROL 10ML LL (SYRINGE) ×4 IMPLANT
SYRINGE IRR TOOMEY STRL 70CC (SYRINGE) ×4 IMPLANT
TOWEL GREEN STERILE FF (TOWEL DISPOSABLE) ×8 IMPLANT
TRAY FOLEY W/BAG SLVR 14FR (SET/KITS/TRAYS/PACK) ×4 IMPLANT
TUBE CONNECTING 12X1/4 (SUCTIONS) ×4 IMPLANT

## 2020-09-08 NOTE — H&P (Signed)
Maria [name] is an 44 y.o. G24  female here for scheduled total abdominal hysterectomy with bilateral salpingectomy due to worsening dysmenorrhea and menorrhagia. Pt had a D&C which showed benign tissue. 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  Of note, pt has cerebral palsy, muscular dystrophy, is nonverbal and wheelchair bound. Her mother and sister are her caregivers and both have power of attorney over her. They have both been involved in the planning of this surgery.   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 Intraoperative stents planned; possible retrograde cystoscopy  Pt with HTN, Type 2 DM, seizure disorder, glaucoma and anemia   Pertinent Gynecological History: Menses: with severe dysmenorrhea Bleeding: menorrhagia Contraception: abstinence DES exposure: denies Blood transfusions: none Sexually transmitted diseases: no past history Previous GYN Procedures: DNC  Last mammogram: n/a Date:   Last pap: normal Date: 03/2020 OB History: G0, P0   Menstrual History: Menarche age:  Patient's last menstrual period was 08/08/2020.    Past Medical History:  Diagnosis Date  . Abnormal uterine bleeding (AUB)   . Anemia   . Bowel incontinence   . 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 CP (cerebral palsy) ([place])   . GERD (gastroesophageal reflux disease)   . Glaucoma   . Hydronephrosis of right kidney   . Hyperlipemia   . Mental retardation    non verbal  . Muscular dystrophy ([place])   . Pneumonia 08/2015  . Pre-diabetes   . Seizures ([place])    6 years none since 10/1980- no longer on medication , no longer sees a neurologist-   . Sepsis ([place])   . Urine incontinence   . Uterine fibroid   . Wheelchair dependent     Past Surgical History:  Procedure Laterality Date  . CLEFT PALATE REPAIR    . DILATATION & CURETTAGE/HYSTEROSCOPY WITH MYOSURE N/A 03/31/2020   Procedure: DILATATION  & CURETTAGE WITH ENDOMETRIAL BIOPSY;  Surgeon: Sherlyn Hay, DO;  Location: [facility];  Service: Gynecology;  Laterality: N/A;  . 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 MULTIPLE TOOTH EXTRACTIONS    . NASAL SEPTUM SURGERY     lifted up  . OPERATIVE ULTRASOUND N/A 03/31/2020   Procedure: OPERATIVE ULTRASOUND;  Surgeon: Sherlyn Hay, DO;  Location: [facility];  Service: Gynecology;  Laterality: N/A;  . RADIOLOGY WITH ANESTHESIA N/A 05/28/2020   Procedure: MRI PELVIS AND ABDOMEN WITH AND WITHOUT CONTRAST;  Surgeon: Radiologist, Medication, MD;  Location: [facility];  Service: Radiology;  Laterality: N/A;    Family History  Problem Relation Age of Onset  . Addison's disease Mother   . Diabetes Mother   . GER disease Mother   . Glaucoma Father   . Diabetes Sister   . Stroke Sister     Social History:  reports that she has never smoked. She has never used smokeless tobacco. She reports that she does not drink alcohol and does not use drugs.  Allergies: No Known Allergies  No medications prior to admission.    Review of Systems  Cardiovascular: Negative for chest pain.  Gastrointestinal: Positive for abdominal pain.  Genitourinary: Positive for menstrual problem and pelvic pain.  Psychiatric/Behavioral: The patient is nervous/anxious.     Last menstrual period 08/08/2020. Physical Exam Vitals and nursing note reviewed. Exam conducted with a chaperone present.  Cardiovascular:  Pulses: Normal pulses.  Pulmonary:     Effort: Pulmonary effort is normal.  Musculoskeletal:     Comments: Pt has cerebral palsy, muscular dystrophy, is nonverbal and wheelchair bound  Neurological:     Mental Status: She is alert. Mental status is at baseline.  Psychiatric:        Behavior: Behavior normal.     No results found for this or any previous visit (from the past 24 hour(s)).  No results found.  Assessment/Plan: 44yo G0 female with  dysmenorrhea, menorrhagia, uterine didelphys for total abdominal hysterectomy with bilateral salpingectomy; intraop urologic care  - Admit - Covid neg - ERAS protocol - Confirm consent again with POA - To OR when ready  Venetia Night Maria [name] 09/08/2020, 2:31 AM

## 2020-09-08 NOTE — Op Note (Signed)
Preoperative diagnosis: Right hydronephrosis, menorrhagia dysmenorrhea Postoperative diagnosis: Right hydronephrosis, right retrocaval ureter, menorrhagia dysmenorrhea  Procedure: Cystoscopy bilateral retrograde pyelogram, bilateral ureteral stent placement, Foley catheter placement  Surgeon: Polly Barner  Anesthesia: General  Indication for procedure: Maria [name] is a 44 year old female with muscular dystrophy and cerebral palsy with menorrhagia and dysmenorrhea as well as a pelvic mass or fibroid who presents today for hysterectomy and bilateral salpingectomy with Dr. Terri Piedra.  Dr. Terri Piedra requested ureteral stents for the patient's unusual anatomy.  The patient also has chronic right hydronephrosis with dilation of the collecting system renal pelvis and proximal ureter and needed further investigation of that finding.  Findings: On exam the vulva and vagina appeared normal.  No prolapse normal.  Meatus appeared normal.  On cystoscopy the urethra and the bladder were unremarkable.  No stone or foreign body in the bladder.  No mucosal lesions.  Ureteral orifice ease were in their normal orthotopic position with clear efflux.  Right retrograde pyelogram-this outlined a single ureter single collecting system unit.  The proximal ureter went medially over the spine and then took a 90 degree right turn and then a 90 degree superior consistent with a retrocaval ureter with dilation of the proximal ureter renal pelvis and collecting system.  There were no strictures or filling defects. There was brisk drainage on follow-up scout fluoroscopy.  Left retrograde pyelogram-this outlined a single ureter single collecting system unit without filling defect, stricture or dilation.  Description of procedure: After consent was obtained patient brought to the operating room.  After adequate anesthesia she was placed lithotomy position and prepped and draped in the usual sterile fashion.  A timeout was performed to confirm  the patient and procedure.  The cystoscope was passed per urethra and the bladder inspected.  The right ureteral orifice was cannulated with a 6 [place] open-ended catheter and retrograde injection of contrast was performed.  A Glidewire was advanced and slipped into the collecting system.  I then advanced the open-ended catheter up to the proximal to mid ureter over the spine and left it here for ureteral identification.  The wire was removed the scope was disassembled and backed out.  The stent was secured in place.  The cystoscope was repassed and the left ureteral orifice cannulated with a 6 [place] open-ended catheter and retrograde injection of contrast was performed.  A Glidewire was then advanced into the left collecting system and the open-ended catheter advanced to the left proximal ureter.  The scope was disassembled and backed out leaving the stent in place.  A 14 French Foley catheter was placed in left to gravity drainage.  The ureteral stents were secured to the Foley and then adapted to the drainage bag.  Patient was then turned over to Dr. Terri Piedra for her procedure.  Complications: None  Blood loss: None  Drains: Bilateral 6 [place] externalized ureteral stents, 14 French Foley catheter  Specimens: None  Disposition: Patient remains in the operating room in the care of Dr. Terri Piedra for her procedure.

## 2020-09-08 NOTE — Op Note (Signed)
Operative Note    Preoperative Diagnosis 1. Dysmenorrhea 2. Menorrhagia 3. Muscular Dystrophy 4. Cerebral Palsy 5. Non verbal  6. Growth restricted   Postoperative Diagnosis  Same  7. Uterine didelphys with large prolapsing polyp 8. S/p stent placement with fluoroscopy  Procedure: Total abdominal hysterectomy with bilateral salpingectomy   Surgeon: Mickle Mallory DO Assist: Cheri Fowler MD  Anesthesia: General  Fluids: LR 887ml EBL: 67ml UOP: 271ml   Findings: Didelphic uterus with double cervix, Prolapsing large polyp from left cervix. Grossly normal ovaries and tubes   Specimen: Uterus, tubes, cervices, polyp   Procedure Note Pt was taken to operating room where general anesthesia was administered and found to be adequate. It took about an hour for anesthesia team to locate and place IV access in both feet and also in left arm ( under ultrasound guidance) - see report. Urologist, placed stents bilaterally after fluoroscopy done - see report.   Pt was placed in dorsal supine position with her arms extended. Her abdomen was then prepped and draped in sterile fashion as well and an appropriate timeout performed. A pfannestial skin incision was made with the scalpel 1 finger breth above her pubic symphysis and carried down to the fascia.  The fascia was incised in the midline and the incision extended laterally with mayo scissors first undermining the fascia. Next, the superior aspect of the fascia was elevated with hemostat clamps and the underlying rectus muscles dissected away.  In a similar fashion the lower aspect of the incision was also grasped, elevated and the fascia dissected from the rectus muscles. The peritoneum was then entered with hemostats. The incision was extended cephalad and caudad and a small alexis retractor placed. Gross survey of the pelvic organs noted didelphyic uterus with larger cornua on left, grossly normal fallopian tubes and ovaries.   The right  fallopian tube was then grasped at its fimbriated end and excised from the nearby ovary hemostatically. Next the utero-ovarian ligament was clamped, cut and suture ligated. The round ligament was separated by cautery.  The broad ligament was then separated from the uterus. Great hemostasis noted. The same was done on the patients left with similar results.  Next the uterosacral ligaments and the cardinal ligaments were then clamped and cut with the ovarian vessel ligated. Again hemostasis was noted. The bladder reflection was dissected off the cervix. Small areas of bleeding were cauterized.   The cervix was then amputated from the superior vagina. A large 5-6cm long prolapsing polyp appearing mass was noted out of the left cervix. The specimen was then removed off the field.  The two openings of the vaginal cuff were closed next in several interrupted figure 8 stitches using 0-vicryl suture. There was a residual vaginal septum noted.   Copious irrigation was done and no bleeding noted.  A final look into the abdominal cavity confirmed no abnormalities or bleeding hence peritoneum was closed next in a running fashion with 2-0 vicryl, the fascia layer was closed using 0- vicryl also in a running fashion. The subcuticular layer was irrigated with no bleeding noted, Finally, the incision was closed using 4-0 vicryl on a keith needle. Benzoin, steristrips and a honey comb dressingwas applied. Counts were noted to be correct. Patient was awakened and taken to recovery room in stable status. She tolerated the procedure well.  Foley and stents were removed at the end of the procedure

## 2020-09-08 NOTE — Discharge Instructions (Signed)
Call office with any complaints [phone] 

## 2020-09-08 NOTE — Consult Note (Signed)
Consultation: Right hydronephrosis, menorrhagia dysmenorrhea Requested by: Dr. Crawford Givens  History of Present Illness: Maria [name] is a 44 year old female who presents today for total abdominal hysterectomy with bilateral salpingoectomy due to worsening dysmenorrhea and menorrhagia.  Of note she has cerebral palsy, muscular dystrophy, nonverbal and wheelchair-bound.  Stents were requested to help identify the ureters and the anatomy.  She also has chronic right hydronephrosis and proximal hydroureter with a normal creatinine.  She underwent a CT scan in 2016 which showed dilated collecting system renal pelvis and proximal ureter that tapered without evidence of stone.  Appearance of possible UPJ obstruction or retrocaval ureter.  Follow-up CT scan was obtained May 2021 which showed stable right hydronephrosis and dilation of the pelvis and proximal ureter with tapering.  Creatinine was 0.72 July 2021.Marland Kitchen  Urinalysis was negative.  She underwent MRI of the abdomen September 2021 which again showed stable right hydronephrosis and proximal ureteral dilation.    Patient seen in the preop area with her mom.  She usually voids in a diaper.  She has no fever or dysuria but occasionally has odiferous urine.  No gross hematuria.  Past Medical History:  Diagnosis Date  . Abnormal uterine bleeding (AUB)   . Anemia   . Bowel incontinence   . 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 CP (cerebral palsy) ([place])   . GERD (gastroesophageal reflux disease)   . Glaucoma   . Hydronephrosis of right kidney   . Hyperlipemia   . Mental retardation    non verbal  . Muscular dystrophy ([place])   . Pneumonia 08/2015  . Pre-diabetes   . Seizures ([place])    6 years none since 10/1980- no longer on medication , no longer sees a neurologist-   . Sepsis ([place])   . Urine incontinence   . Uterine fibroid   . Wheelchair dependent    Past Surgical History:  Procedure Laterality Date  . CLEFT PALATE REPAIR    . DILATATION &  CURETTAGE/HYSTEROSCOPY WITH MYOSURE N/A 03/31/2020   Procedure: DILATATION & CURETTAGE WITH ENDOMETRIAL BIOPSY;  Surgeon: Sherlyn Hay, DO;  Location: [facility];  Service: Gynecology;  Laterality: N/A;  . 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 MULTIPLE TOOTH EXTRACTIONS    . NASAL SEPTUM SURGERY     lifted up  . OPERATIVE ULTRASOUND N/A 03/31/2020   Procedure: OPERATIVE ULTRASOUND;  Surgeon: Sherlyn Hay, DO;  Location: [facility];  Service: Gynecology;  Laterality: N/A;  . RADIOLOGY WITH ANESTHESIA N/A 05/28/2020   Procedure: MRI PELVIS AND ABDOMEN WITH AND WITHOUT CONTRAST;  Surgeon: Radiologist, Medication, MD;  Location: [facility];  Service: Radiology;  Laterality: N/A;    Home Medications:  Medications Prior to Admission  Medication Sig Dispense Refill Last Dose  . Cholecalciferol (VITAMIN D) 125 MCG (5000 UT) CAPS Take 5,000 Units by mouth daily.   09/07/2020 at Unknown time  . ferrous sulfate 325 (65 FE) MG tablet Take 325 mg by mouth daily with breakfast. Crushes it for patient   09/07/2020 at Unknown time  . latanoprost (XALATAN) 0.005 % ophthalmic solution Place 1 drop into the left eye at bedtime.   09/07/2020 at Unknown time  . Multiple Vitamin (MULTIVITAMIN WITH MINERALS) TABS tablet Take 1 tablet by mouth daily.   09/07/2020 at Unknown time  . simvastatin (ZOCOR) 40 MG tablet Take 40 mg by mouth daily.   09/07/2020 at Unknown time  .  traMADol (ULTRAM) 50 MG tablet Take 50 mg by mouth every 6 (six) hours as needed for moderate pain.   09/07/2020 at Unknown time  . acetaminophen (TYLENOL) 650 MG CR tablet Take 650 mg by mouth every 8 (eight) hours as needed for pain.   More than a month at Unknown time  . baclofen (LIORESAL) 10 MG tablet Take 5 mg by mouth 3 (three) times daily as needed for muscle spasms.     Marland Kitchen dicyclomine (BENTYL) 20 MG tablet Take 0.5 tablets (10 mg total) by mouth 2 (two) times daily as needed for spasms (and belly  pain). 10 tablet 0   . ibuprofen (ADVIL) 100 MG/5ML suspension Take 600 mg by mouth every 6 (six) hours as needed for mild pain.     . magnesium citrate SOLN Take 0.25 Bottles by mouth daily as needed for moderate constipation.    More than a month at Unknown time  . polyethylene glycol (MIRALAX) 17 g packet Take 17 g by mouth in the morning, at noon, and at bedtime. Please take 1 scoop 3 times a day for 7 days; then 1 scoop once a day therafter 14 each 0   . senna-docusate (SENOKOT-S) 8.6-50 MG tablet Take 1 tablet by mouth at bedtime as needed for mild constipation. 10 tablet 0    Allergies: No Known Allergies  Family History  Problem Relation Age of Onset  . Addison's disease Mother   . Diabetes Mother   . GER disease Mother   . Glaucoma Father   . Diabetes Sister   . Stroke Sister    Social History:  reports that she has never smoked. She has never used smokeless tobacco. She reports that she does not drink alcohol and does not use drugs.  ROS: A complete review of systems was performed.  All systems are negative except for pertinent findings as noted. Review of Systems  All other systems reviewed and are negative. Mother contributed to history and ROS.    Physical Exam:  Vital signs in last 24 hours: Temp:  [97.6 F (36.4 C)] 97.6 F (36.4 C) (12/21 0634) Pulse Rate:  [97] 97 (12/21 0634) Resp:  [17] 17 (12/21 0634) BP: (150)/(87) 150/87 (12/21 0634) SpO2:  [100 %] 100 % (12/21 0634) Weight:  [25.4 kg] 25.4 kg (12/21 0634) General:  Alert, No acute distress HEENT: Normocephalic, atraumatic Cardiovascular: Regular rate and rhythm Lungs: Regular rate and effort Abdomen: Soft, nontender, nondistended, no abdominal masses Back: No CVA tenderness Extremities: No edema Neurologic: Grossly intact  Laboratory Data:  No results found for this or any previous visit (from the past 24 hour(s)). Recent Results (from the past 240 hour(s))  SARS CORONAVIRUS 2 (TAT 6-24 HRS)  Nasopharyngeal Nasopharyngeal Swab     Status: None   Collection Time: 09/04/20  6:57 PM   Specimen: Nasopharyngeal Swab  Result Value Ref Range Status   SARS Coronavirus 2 NEGATIVE NEGATIVE Final    Comment: (NOTE) SARS-CoV-2 target nucleic acids are NOT DETECTED.  The SARS-CoV-2 RNA is generally detectable in upper and lower respiratory specimens during the acute phase of infection. Negative results do not preclude SARS-CoV-2 infection, do not rule out co-infections with other pathogens, and should not be used as the sole basis for treatment or other patient management decisions. Negative results must be combined with clinical observations, patient history, and epidemiological information. The expected result is Negative.  Fact Sheet for Patients: SugarRoll.be  Fact Sheet for Healthcare Providers: https://www.woods-mathews.com/  This test is  not yet approved or cleared by the [place] and  has been authorized for detection and/or diagnosis of SARS-CoV-2 by FDA under an Emergency Use Authorization (EUA). This EUA will remain  in effect (meaning this test can be used) for the duration of the COVID-19 declaration under Se ction 564(b)(1) of the Act, 21 U.S.C. section 360bbb-3(b)(1), unless the authorization is terminated or revoked sooner.  Performed at [facility], [place] [address] [place], [place] [address]    Creatinine: No results for input(s): CREATININE in the last 168 hours.  Impression/Assessment:  Dysmenorrhea, menorrhagia, cerebral palsy, muscular dystrophy, wheelchair-bound, right hydronephrosis with dilation of the collecting system renal pelvis and proximal ureter-  Plan:  I discussed with the patient and Mother the nature, potential benefits, risks and alternatives to cystoscopy with bilateral retrograde pyelogram, bilateral ureteral stent placement, right ureteroscopy, including side effects of the proposed  treatment, the likelihood of the patient achieving the goals of the procedure, and any potential problems that might occur during the procedure or recuperation.All questions answered. Patient elects to proceed.    Festus Aloe 09/08/2020, 7:26 AM

## 2020-09-08 NOTE — Anesthesia Procedure Notes (Signed)
Procedure Name: Intubation Date/Time: 09/08/2020 8:29 AM Performed by: Candis Shine, CRNA Pre-anesthesia Checklist: Patient identified, Emergency Drugs available, Suction available and Patient being monitored Patient Re-evaluated:Patient Re-evaluated prior to induction Oxygen Delivery Method: Circle System Utilized Preoxygenation: Pre-oxygenation with 100% oxygen Induction Type: Inhalational induction Ventilation: Mask ventilation without difficulty and Oral airway inserted - appropriate to patient size Laryngoscope Size: Mac and 2 Grade View: Grade I Tube type: Oral Tube size: 5.0 mm Number of attempts: 1 Airway Equipment and Method: Stylet and Oral airway Placement Confirmation: ETT inserted through vocal cords under direct vision,  positive ETCO2 and breath sounds checked- equal and bilateral Secured at: 16 cm Tube secured with: Tape Dental Injury: Teeth and Oropharynx as per pre-operative assessment

## 2020-09-08 NOTE — Transfer of Care (Signed)
Immediate Anesthesia Transfer of Care Note  Patient: Maria [name]  Procedure(s) Performed: TOTAL ABDOMINAL HYSTERECTOMY WITH BILATERAL SALPINGECTOMY (Bilateral Uterus) BILATERAL URETERAL STENT REMOVAL (Bilateral Ureter) CYSTOSCOPY WITH BILATERAL RETROGRADE PYELOGRAMS, BILATERAL STENT PLACEMENT (N/A Bladder)  Patient Location: PACU  Anesthesia Type:General  Level of Consciousness: awake and alert   Airway & Oxygen Therapy: Patient Spontanous Breathing  Post-op Assessment: Report given to RN and Post -op Vital signs reviewed and stable  Post vital signs: Reviewed and stable  Last Vitals:  Vitals Value Taken Time  BP 149/117 09/08/20 1040  Temp    Pulse    Resp 15 09/08/20 1044  SpO2    Vitals shown include unvalidated device data.  Last Pain:  Vitals:   09/08/20 0634  TempSrc: Axillary         Complications: No complications documented.

## 2020-09-09 ENCOUNTER — Encounter (HOSPITAL_COMMUNITY): Payer: Self-pay | Admitting: Obstetrics and Gynecology

## 2020-09-09 LAB — SURGICAL PATHOLOGY

## 2020-09-09 NOTE — Progress Notes (Signed)
Yellow Mews MD notified, no new orders  09/09/20 1246  Vitals  Temp 99.4 F (37.4 C)  Temp Source Oral  BP (!) 146/86  MAP (mmHg) 104  BP Location Left Leg  BP Method Automatic  Patient Position (if appropriate) Lying  Pulse Rate (!) 108  Pulse Rate Source Monitor  Resp 16  MEWS COLOR  MEWS Score Color Green  Oxygen Therapy  SpO2 100 %  O2 Device Room Air  MEWS Score  MEWS Temp 0  MEWS Systolic 0  MEWS Pulse 1  MEWS RR 0  MEWS LOC 0  MEWS Score 1

## 2020-09-09 NOTE — Progress Notes (Signed)
1 Day Post-Op Procedure(s) (LRB): TOTAL ABDOMINAL HYSTERECTOMY WITH BILATERAL SALPINGECTOMY (Bilateral) CYSTOSCOPY WITH BILATERAL RETROGRADE PYELOGRAMS, BILATERAL STENT PLACEMENT (N/A) BILATERAL URETERAL STENT REMOVAL (Bilateral)  Subjective: Pt sleeping comfortably. Sister at bedside and reports no complaints. Easily arousable  Objective: I have reviewed patient's vital signs, intake and output, medications and labs.   General: no distress Dressing c/d/i  Assessment: s/p Procedure(s): TOTAL ABDOMINAL HYSTERECTOMY WITH BILATERAL SALPINGECTOMY (Bilateral) CYSTOSCOPY WITH BILATERAL RETROGRADE PYELOGRAMS, BILATERAL STENT PLACEMENT (N/A) BILATERAL URETERAL STENT REMOVAL (Bilateral): stable  Plan: Advance diet Routine post op care  Reviewed procedure and images with pts sister Likely discharge home tomorrow  LOS: 1 day    Amairani Shuey W Sherman [place] 09/09/2020, 8:34 AM

## 2020-09-09 NOTE — Anesthesia Postprocedure Evaluation (Signed)
Anesthesia Post Note  Patient: Maria [name]  Procedure(s) Performed: TOTAL ABDOMINAL HYSTERECTOMY WITH BILATERAL SALPINGECTOMY (Bilateral Uterus) BILATERAL URETERAL STENT REMOVAL (Bilateral Ureter) CYSTOSCOPY WITH BILATERAL RETROGRADE PYELOGRAMS, BILATERAL STENT PLACEMENT (N/A Bladder)     Patient location during evaluation: PACU Anesthesia Type: General Level of consciousness: awake and alert Pain management: pain level controlled Vital Signs Assessment: post-procedure vital signs reviewed and stable Respiratory status: spontaneous breathing, nonlabored ventilation, respiratory function stable and patient connected to nasal cannula oxygen Cardiovascular status: blood pressure returned to baseline and stable Postop Assessment: no apparent nausea or vomiting Anesthetic complications: no   No complications documented.  Last Vitals:  Vitals:   09/09/20 1500 09/09/20 2015  BP: (!) 127/91 109/73  Pulse: 68 99  Resp: 18 18  Temp: 37.1 C 37.2 C  SpO2:  100%    Last Pain:  Vitals:   09/09/20 2015  TempSrc: Oral  PainSc:    Pain Goal:                   Tiajuana Amass

## 2020-09-10 MED ORDER — IBUPROFEN 100 MG/5ML PO SUSP: 600.0000 mg | Freq: Four times a day (QID) | ORAL | 1 refills | Status: AC | PRN

## 2020-09-10 MED ORDER — OXYCODONE HCL 5 MG/5ML PO SOLN: 5.0000 mg | ORAL | 0 refills | Status: AC | PRN

## 2020-09-10 MED ORDER — OXYCODONE-ACETAMINOPHEN 5-325 MG PO TABS: 1.0000 | ORAL_TABLET | ORAL | 0 refills | Status: DC | PRN

## 2020-09-10 MED ORDER — IBUPROFEN 100 MG/5ML PO SUSP: 600.0000 mg | Freq: Four times a day (QID) | ORAL | 1 refills | Status: DC | PRN

## 2020-09-10 NOTE — Progress Notes (Signed)
2 Days Post-Op Procedure(s) (LRB): TOTAL ABDOMINAL HYSTERECTOMY WITH BILATERAL SALPINGECTOMY (Bilateral) CYSTOSCOPY WITH BILATERAL RETROGRADE PYELOGRAMS, BILATERAL STENT PLACEMENT (N/A) BILATERAL URETERAL STENT REMOVAL (Bilateral)  Subjective: Patient is non verbal. Appears to indicate she is hungry,. Refuses drink offered. Family not here yet. Breakfast en route per nurse.   Objective: I have reviewed patient's vital signs, intake and output, medications and labs.  General: alert, cooperative and no distress GI: incision: indurated and dry and abdomen non distended, pt guarded on exam Extremities: no edema   Assessment: s/p Procedure(s): TOTAL ABDOMINAL HYSTERECTOMY WITH BILATERAL SALPINGECTOMY (Bilateral) CYSTOSCOPY WITH BILATERAL RETROGRADE PYELOGRAMS, BILATERAL STENT PLACEMENT (N/A) BILATERAL URETERAL STENT REMOVAL (Bilateral): stable and progressing well  Plan: Advance diet Discharge home  LOS: 2 days    Reggie Welge W Torie Priebe 09/10/2020, 8:34 AM

## 2020-09-10 NOTE — Plan of Care (Signed)
  Problem: Education: Goal: Knowledge of General Education information will improve Description Including pain rating scale, medication(s)/side effects and non-pharmacologic comfort measures Outcome: Progressing   

## 2020-09-10 NOTE — Care Management Important Message (Signed)
Important Message  Patient Details  Name: Marie Borowski MRN: [id number] Date of Birth: [birth year]   Medicare Important Message Given:  Yes     Brayant Dorr 09/10/2020, 3:30 PM

## 2020-09-10 NOTE — Discharge Summary (Signed)
Physician Discharge Summary  Patient ID: Maria [name] MRN: [id number] DOB/AGE: [birth year] 44 y.o.  Admit date: 09/08/2020 Discharge date: 09/10/2020  Admission Diagnoses: menorrhagia, dysmenorrhea  Discharge Diagnoses:  Active Problems:   Secondary dysmenorrhea   Discharged Condition: stable  Hospital Course: Pt recovered well post operatively. Pain management well controlled  Consults: None  Significant Diagnostic Studies: labs: wnl  Treatments: analgesia: acetaminophen w/ codeine and ibuprofen and surgery: TAH/BL  Discharge Exam: Blood pressure 122/81, pulse (!) 102, temperature 97.8 F (36.6 C), temperature source Oral, resp. rate 17, height 4' (1.219 m), weight 25.4 kg, last menstrual period 08/08/2020, SpO2 100 %. General appearance: alert, cooperative and no distress GI: normal findings: dressing with old blood, abdomen soft, non distended Pelvic: external genitalia normal  Disposition: Discharge disposition: 01-Home or Self Care       Discharge Instructions    Call MD for:  difficulty breathing, headache or visual disturbances   Complete by: As directed    Call MD for:  persistant dizziness or light-headedness   Complete by: As directed    Call MD for:  persistant nausea and vomiting   Complete by: As directed    Call MD for:  redness, tenderness, or signs of infection (pain, swelling, redness, odor or green/yellow discharge around incision site)   Complete by: As directed    Call MD for:  severe uncontrolled pain   Complete by: As directed    Call MD for:  temperature >100.4   Complete by: As directed    Change dressing   Complete by: As directed    Diet - low sodium heart healthy   Complete by: As directed    Diet general   Complete by: As directed    Discharge instructions   Complete by: As directed    Call office with any complaints [phone]   Discharge wound care:   Complete by: As directed    Remove dressing after a week. May  shower, keep clean and dry   Driving Restrictions   Complete by: As directed    N/a   Increase activity slowly   Complete by: As directed    Lifting restrictions   Complete by: As directed    N/a   Sexual Activity Restrictions   Complete by: As directed    None       Follow-up Information    Sherlyn Hay, DO. Schedule an appointment as soon as possible for a visit in 2 week(s).   Specialty: Obstetrics and Gynecology Why: For wound re-check and in 6 weeks for post op visit Contact information: [address] [place] [address] [phone]        Call Festus Aloe, MD.   Specialty: Urology Contact information: [place] [address] [phone]               Signed: Isaiah Serge 09/10/2020, 8:45 AM

## 2020-09-30 NOTE — Therapy (Signed)
[facility] [address] [place], [place], [address] Phone: [phone]   Fax:  [phone]  Patient Details  Name: Shreena Baines MRN: [id number] Date of Birth: 20-Aug-1976 Referring Provider:  No ref. provider found  Encounter Date: 09/30/2020  PHYSICAL THERAPY DISCHARGE SUMMARY/Non visit discharge   Visits from Start of Care: 5  Current functional level related to goals / functional outcomes: Unknown as did not return after most recent recert. Had been put on hold until received new braces. See goals below for last assessment.   Remaining deficits: weakness   Education / Equipment: HEP  Plan: Patient agrees to discharge.  Patient goals were not met. Patient is being discharged due to not returning since the last visit.  ?????         PT Short Term Goals - 12/05/19 1800      PT SHORT TERM GOAL #1   Title STGs=LTGs           PT Long Term Goals - 12/05/19 1801      PT LONG TERM GOAL #1   Title Mother will be independent with progression of HEP for stretching, stregnthening, standing and gait.    Baseline Mother reports they have not been working on tall kneeling only pt moving in to position when she chooses.    Time 4    Period Weeks    Status On-going    Target Date 01/04/20      PT LONG TERM GOAL #2   Title The patient will tolerate standing x 3 minutes min assist with proper AFOs donned.    Time 4    Period Weeks    Status Revised    Target Date 01/04/20      PT LONG TERM GOAL #3   Title Pt will perform short distance gait, 24f with AFOs and posterior rollator, with mod assist, for improved household mobility.    Baseline nonambulatory currently. Unable to attempt due to not having proper equipment and no changes have been made to AFOs as mother has not followed up with [employer] yet.    Time 4    Period Weeks    Status On-going    Target Date 01/04/20           EElecta Sniff PT, DPT,  NCS 09/30/2020, 11:11 AM  [facility] [address] [place] [address]Phone: [phone]  Fax:  [phone]

## 2021-01-16 IMAGING — RF DG RETROGRADE PYELOGRAM
1 series · 4 of 4 positions shown · non-contrast
Comparison: None.

CLINICAL DATA: 44-year-old female, retrograde pyelogram.

EXAM:
RETROGRADE PYELOGRAM

[Series 1: run · 4 of 4 slices shown]
[im 1/4]
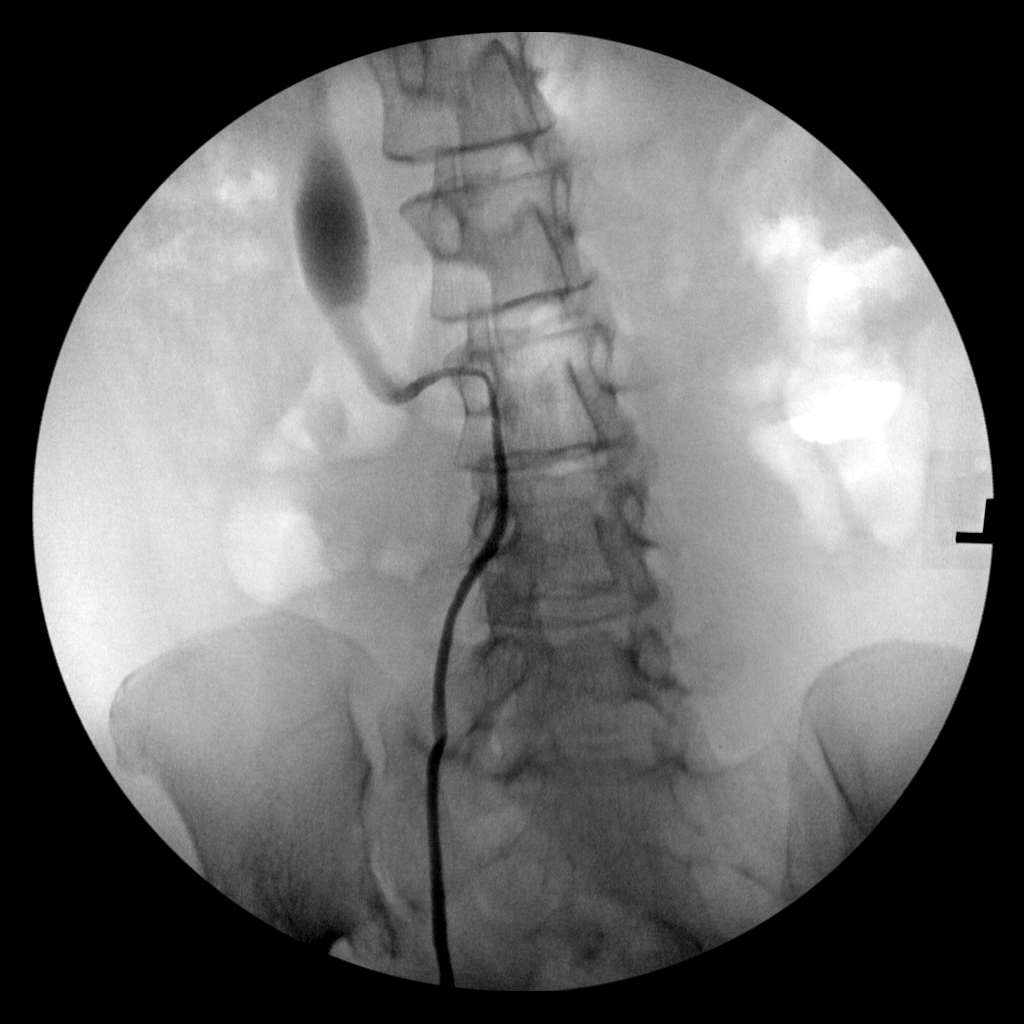
[im 2/4]
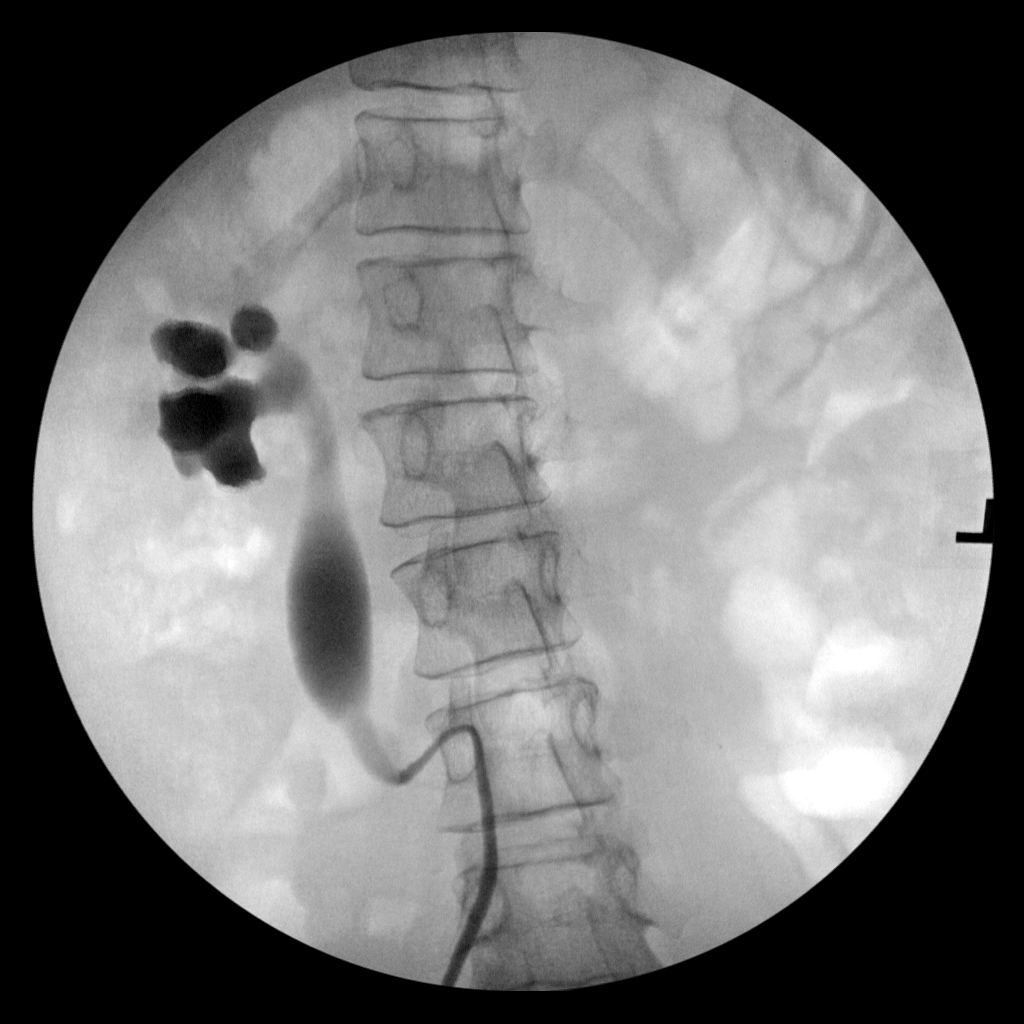
[im 3/4]
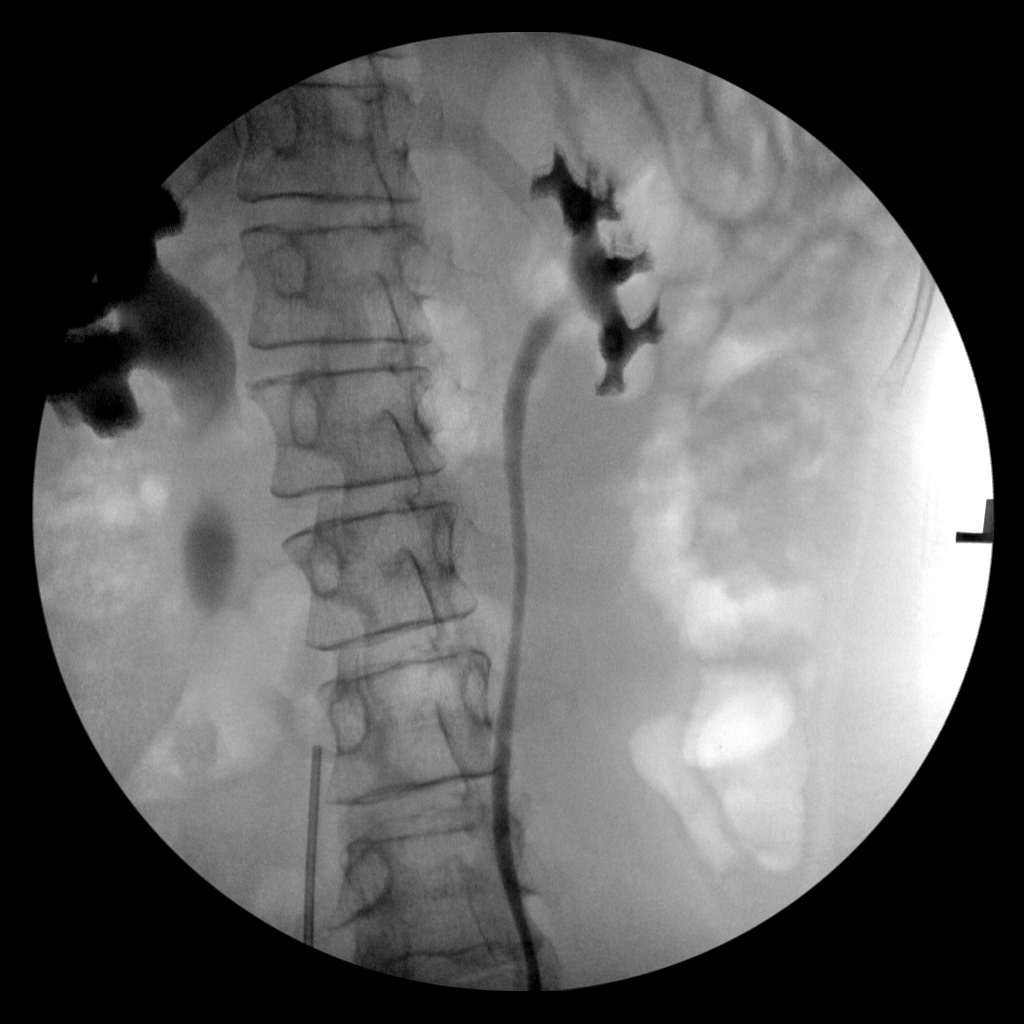
[im 4/4]
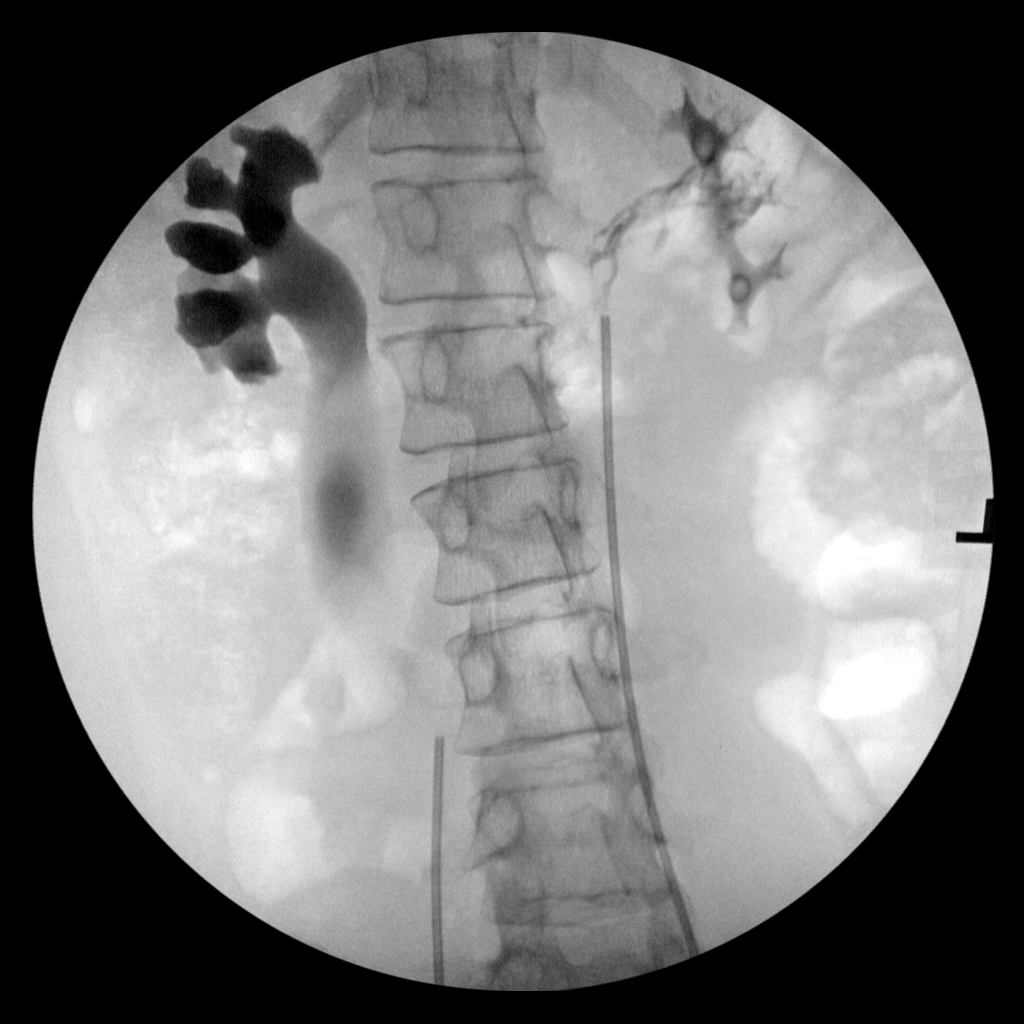

[4 of 4 positions shown; findings below may reference images not displayed]

FLUOROSCOPY TIME:  Fluoroscopy Time:  1 minutes, 1 second

Radiation Exposure Index (if provided by the fluoroscopic device):
5.12 mGy

Number of Acquired Spot Images: 4
FINDINGS: Laterality is not indicated on this study. Retrograde cannulation of
both ureters. The collecting system on the left side of the screen
demonstrates moderate hydro nephro ureter. The collecting system on
the right side of the screen is nondilated with multiple filling
defects as could be seen with introduction of air bubbles versus
nephrolithiasis.
IMPRESSION: Four fluoroscopic images obtained during retrograde pyelogram. No
laterality is indicated on these images. Please refer to the
operative report for further details.

## 2022-02-19 ENCOUNTER — Other Ambulatory Visit: Payer: Self-pay

## 2022-02-19 ENCOUNTER — Emergency Department (HOSPITAL_BASED_OUTPATIENT_CLINIC_OR_DEPARTMENT_OTHER)
Admission: EM | Admit: 2022-02-19 | Discharge: 2022-02-19 | Disposition: A | Discharge status: Home / Self Care | Payer: Medicare Other | Attending: Emergency Medicine | Admitting: Emergency Medicine

## 2022-02-19 ENCOUNTER — Encounter (HOSPITAL_BASED_OUTPATIENT_CLINIC_OR_DEPARTMENT_OTHER): Payer: Self-pay | Admitting: Emergency Medicine

## 2022-02-19 ENCOUNTER — Emergency Department (HOSPITAL_BASED_OUTPATIENT_CLINIC_OR_DEPARTMENT_OTHER): Payer: Medicare Other

## 2022-02-19 DIAGNOSIS — X500XXA Overexertion from strenuous movement or load, initial encounter: Secondary | ICD-10-CM | POA: Insufficient documentation

## 2022-02-19 DIAGNOSIS — H1032 Unspecified acute conjunctivitis, left eye: Secondary | ICD-10-CM | POA: Insufficient documentation

## 2022-02-19 DIAGNOSIS — S92515A Nondisplaced fracture of proximal phalanx of left lesser toe(s), initial encounter for closed fracture: Secondary | ICD-10-CM | POA: Diagnosis not present

## 2022-02-19 DIAGNOSIS — S99922A Unspecified injury of left foot, initial encounter: Secondary | ICD-10-CM | POA: Diagnosis present

## 2022-02-19 MED ORDER — MIDAZOLAM HCL 2 MG/ML PO SYRP
2.0000 mg | ORAL_SOLUTION | Freq: Once | ORAL | Status: AC
  Administered 2022-02-19: 2 mg via ORAL

## 2022-02-19 MED ORDER — TOBRAMYCIN-DEXAMETHASONE 0.3-0.1 % OP SUSP: 1.0000 [drp] | OPHTHALMIC | 0 refills | Status: AC

## 2022-02-19 MED ORDER — HYDROCODONE-ACETAMINOPHEN 7.5-325 MG/15ML PO SOLN: 10.0000 mL | Freq: Four times a day (QID) | ORAL | 0 refills | Status: AC | PRN

## 2022-02-19 MED ORDER — MIDAZOLAM HCL 2 MG/ML PO SYRP
2.0000 mg | ORAL_SOLUTION | Freq: Once | ORAL | Status: AC
  Administered 2022-02-19: 2 mg via ORAL
  Filled 2022-02-19: qty 2

## 2022-02-19 NOTE — Discharge Instructions (Addendum)
The patient was seen today for left foot pain and left eye pain.  The patient had a broken fourth toe on the left foot.  She also has conjunctivitis of the left eye.  The toe should be buddy taped over the next 1 to 2 weeks.  Follow-up with Dr.Xu, orthopedics, if needed.  I have prescribed a short course of pain medication.  I have also prescribed eyedrops for possible conjunctivitis in the patient's left eye.  I do recommend eyedrops in both eyes as this is highly contagious and she will probably end up with the condition in both eyes.  Follow-up with ophthalmology or other eye doctor as needed

## 2022-02-19 NOTE — ED Triage Notes (Signed)
Pt arrives to ED with mother with c/o left foot pain and right eye swelling. Pt is non-verbal. The foot pain started yesterday, mother explains that pt will not let anyone touch her foot and the foot is extended. Pt also presents with right eyelid swelling x1 week.

## 2022-02-19 NOTE — ED Provider Notes (Signed)
[facility] EMERGENCY DEPT Provider Note   CSN: [id number] Arrival date & time: 02/19/22  1740     History  Chief Complaint  Patient presents with   Foot Pain   Eye Problem    Maria [name] is a 46 y.o. female.  Patient presents with left toe pain and left eye swelling.  The patient is nonverbal so the patient's legal guardian provides information.  States that the patient typically stands on the edge of the bed and puts a lot of pressure on her foot.  Patient has been unwilling to use said foot or let anyone touch the foot.  Patient also has drainage and redness of the left eye.  The patient has significant developmental history including CP, muscular dystrophy, bowel incontinence, seizures, urinary continence, and others.  HPI     Home Medications Prior to Admission medications   Medication Sig Start Date End Date Taking? Authorizing Provider  HYDROcodone-acetaminophen (HYCET) 7.5-325 mg/15 ml solution Take 10 mLs by mouth 4 (four) times daily as needed for up to 5 days for moderate pain. 02/19/22 02/24/22 Yes Deion Forgue, Casimer Leek, PA-C  tobramycin-dexamethasone [facility]) ophthalmic solution Place 1 drop into both eyes every 4 (four) hours while awake. 02/19/22  Yes Dorothyann Peng, PA-C  baclofen (LIORESAL) 10 MG tablet Take 5 mg by mouth 3 (three) times daily as needed for muscle spasms.    [provider]  Cholecalciferol (VITAMIN D) 125 MCG (5000 UT) CAPS Take 5,000 Units by mouth daily.    [provider]  dicyclomine (BENTYL) 20 MG tablet Take 0.5 tablets (10 mg total) by mouth 2 (two) times daily as needed for spasms (and belly pain). 02/14/20   Margarita Mail, PA-C  ferrous sulfate 325 (65 FE) MG tablet Take 325 mg by mouth daily with breakfast. Crushes it for patient    [provider]  ibuprofen (ADVIL) 100 MG/5ML suspension Take 30 mLs (600 mg total) by mouth every 6 (six) hours as needed for mild pain or moderate pain. 09/10/20   Banga,  Cecilia Worema, DO  latanoprost (XALATAN) 0.005 % ophthalmic solution Place 1 drop into the left eye at bedtime. 02/18/15   [provider]  magnesium citrate SOLN Take 0.25 Bottles by mouth daily as needed for moderate constipation.     [provider]  Multiple Vitamin (MULTIVITAMIN WITH MINERALS) TABS tablet Take 1 tablet by mouth daily.    [provider]  polyethylene glycol (MIRALAX) 17 g packet Take 17 g by mouth in the morning, at noon, and at bedtime. Please take 1 scoop 3 times a day for 7 days; then 1 scoop once a day therafter 04/09/20   Garald Balding, PA-C  simvastatin (ZOCOR) 40 MG tablet Take 40 mg by mouth daily.    [provider]      Allergies    Patient has no known allergies.    Review of Systems   Review of Systems  Reason unable to perform ROS: Patient is nonverbal.   Physical Exam Updated Vital Signs BP 134/88 (BP Location: Right Arm)   Pulse 99   Temp 99.5 F (37.5 C) (Oral)   Resp 20   Ht 4' (1.219 m)   Wt 25.4 kg   LMP 08/08/2020   SpO2 97%   BMI 17.09 kg/m  Physical Exam Vitals and nursing note reviewed.  Constitutional:      General: She is not in acute distress. HENT:     Head: Atraumatic.  Eyes:  General:        Left eye: Discharge present. Cardiovascular:     Rate and Rhythm: Normal rate.  Pulmonary:     Effort: Pulmonary effort is normal.  Musculoskeletal:        General: Tenderness present.     Comments: Tenderness to palpation near the fourth toe of the left foot  Neurological:     Mental Status: She is alert.     Comments: Unable to assess due to patient's baseline, the patient's relative state the patient is at baseline    ED Results / Procedures / Treatments   Labs (all labs ordered are listed, but only abnormal results are displayed) Labs Reviewed - No data to display  EKG None  Radiology DG Foot Complete Left  Result Date: 02/19/2022 CLINICAL DATA:  Left foot pain. EXAM: LEFT FOOT  - COMPLETE 3+ VIEW COMPARISON:  None Available. FINDINGS: Possible pes planus on these nonweightbearing views. Abnormal alignment of the hindfoot and midfoot, presumably chronic. Hammertoe deformity of the toes. The bones are under mineralized with a gracile appearance. There is a transverse nondisplaced fracture of the fourth toe proximal phalanx. No intra-articular extension. No other fracture. No bony destruction or erosive change. No focal soft tissue abnormalities. IMPRESSION: 1. Nondisplaced fourth toe proximal phalanx fracture. 2. 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 Electronically Signed   By: Keith Rake M.D.   On: 02/19/2022 20:33    Procedures Procedures    Medications Ordered in ED Medications  midazolam (VERSED) 2 MG/ML syrup 2 mg (2 mg Oral Given 02/19/22 1941)  midazolam (VERSED) 2 MG/ML syrup 2 mg (2 mg Oral Given 02/19/22 1950)    ED Course/ Medical Decision Making/ A&P                           Medical Decision Making Amount and/or Complexity of Data Reviewed Radiology: ordered.  Risk Prescription drug management.   Chief complaint of toe pain.  I ordered and interpreted imaging including plain films of the left foot.  A nondisplaced fourth toe proximal phalanx fracture was noted.  I agree with the radiologist findings.  The patient also had left eye discharge.  There is redness to the sclera as well.  This looks consistent with a conjunctivitis.  Due to the difficulty in distinguishing between bacterial, allergic, and viral conjunctivitis plan to discharge with antibiotic eyedrops for coverage.  I have had the toe buddy taped.  I did order Versed for anxiety for the patient to help with both the x-ray and the taping of the toes.  Plan to discharge the patient home with a short weight-adjusted dose of hydrocodone.  Orthopedic follow-up information provided.        Final Clinical Impression(s) / ED Diagnoses Final  diagnoses:  Acute conjunctivitis of left eye, unspecified acute conjunctivitis type  Closed nondisplaced fracture of proximal phalanx of lesser toe of left foot, initial encounter    Rx / DC Orders ED Discharge Orders          Ordered    tobramycin-dexamethasone (TOBRADEX) ophthalmic solution  Every 4 hours while awake        02/19/22 2139    HYDROcodone-acetaminophen (HYCET) 7.5-325 mg/15 ml solution  4 times daily PRN        02/19/22 2139              Ronny Bacon 70/96/28 3662    Campbell Stall  P, DO 02/20/22 1828

## 2022-06-23 ENCOUNTER — Ambulatory Visit: Payer: Medicare Other | Attending: Family Medicine | Admitting: Physical Therapy

## 2022-06-23 DIAGNOSIS — M6281 Muscle weakness (generalized): Secondary | ICD-10-CM | POA: Diagnosis present

## 2022-06-23 DIAGNOSIS — G809 Cerebral palsy, unspecified: Secondary | ICD-10-CM | POA: Diagnosis present

## 2022-06-23 NOTE — Therapy (Signed)
OUTPATIENT PHYSICAL THERAPY WHEELCHAIR EVALUATION   Patient Name: Maria [name] MRN: [id number] DOB:30-Oct-1975, 46 y.o., female Today's Date: 06/23/2022   PT End of Session - 06/23/22 1109     Visit Number 1    Number of Visits 1    Authorization Type Medicare    PT Start Time 1107   pt late   PT Stop Time 1497   wheelchair evaluation   PT Time Calculation (min) 18 min    Activity Tolerance Patient tolerated treatment well;No increased pain    Behavior During Therapy Restless             Past Medical History:  Diagnosis Date   Abnormal uterine bleeding (AUB)    Anemia    Bowel incontinence    Cleft lip    Surgery to repair   CP (cerebral palsy) (HCC)    GERD (gastroesophageal reflux disease)    Glaucoma    Hydronephrosis of right kidney    Hyperlipemia    Mental retardation    non verbal   Muscular dystrophy ([place])    Pneumonia 08/2015   Pre-diabetes    Seizures ([place])    6 years none since 10/1980- no longer on medication , no longer sees a neurologist-    Sepsis ([place])    Urine incontinence    Uterine fibroid    Wheelchair dependent    Past Surgical History:  Procedure Laterality Date   ABDOMINAL HYSTERECTOMY  09/08/2020   CLEFT PALATE REPAIR     CYSTOSCOPY WITH RETROGRADE PYELOGRAM, URETEROSCOPY AND STENT PLACEMENT N/A 09/08/2020   Procedure: CYSTOSCOPY WITH BILATERAL RETROGRADE PYELOGRAMS, BILATERAL STENT PLACEMENT;  Surgeon: Maria Aloe, MD;  Location: [facility];  Service: Urology;  Laterality: N/A;   DILATATION & CURETTAGE/HYSTEROSCOPY WITH MYOSURE N/A 03/31/2020   Procedure: DILATATION & CURETTAGE WITH ENDOMETRIAL BIOPSY;  Surgeon: Maria Hay, DO;  Location: [facility];  Service: Gynecology;  Laterality: N/A;   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   HYSTERECTOMY ABDOMINAL WITH SALPINGECTOMY Bilateral 09/08/2020   Procedure: TOTAL ABDOMINAL HYSTERECTOMY WITH BILATERAL SALPINGECTOMY;  Surgeon: Maria Hay, DO;  Location: [facility];  Service: Gynecology;  Laterality: Bilateral;   MULTIPLE TOOTH EXTRACTIONS     NASAL SEPTUM SURGERY     lifted up   OPERATIVE ULTRASOUND N/A 03/31/2020   Procedure: OPERATIVE ULTRASOUND;  Surgeon: Maria Hay, DO;  Location: [facility];  Service: Gynecology;  Laterality: N/A;   RADIOLOGY WITH ANESTHESIA N/A 05/28/2020   Procedure: MRI PELVIS AND ABDOMEN WITH AND WITHOUT CONTRAST;  Surgeon: Radiologist, Medication, MD;  Location: [place] [facility];  Service: Radiology;  Laterality: N/A;   STENT REMOVAL Bilateral 09/08/2020   Procedure: BILATERAL URETERAL STENT REMOVAL;  Surgeon: Maria Hay, DO;  Location: [facility];  Service: Gynecology;  Laterality: Bilateral;   Patient Active Problem List   Diagnosis Date Noted   Secondary dysmenorrhea 09/08/2020   Rash 10/09/2017   Cerebral palsy ([place]) 10/09/2017   Bacteria in urine 10/09/2017   Lactic acidosis 10/09/2017   Pneumonia 10/07/2017   Respiratory failure, acute ([place]) 08/28/2015   Pyrexia    Difficult intravenous access    Sepsis ([place]) 08/27/2015   Mental retardation 08/27/2015   CAP (community acquired pneumonia) 08/27/2015    PCP: Maria Rima, MD  REFERRING PROVIDER: Janie Morning, DO   THERAPY DIAG:  Muscle weakness (generalized)  Cerebral palsy, unspecified type [facility])  Rationale for Evaluation and Treatment Habilitation  SUBJECTIVE:                                                                                                                                                                                           SUBJECTIVE STATEMENT: Pt present for wheelchair evaluation. Pt accompanied by her mother/caregiver.  PRECAUTIONS: Fall  WEIGHT BEARING RESTRICTIONS No   OCCUPATION: N/A  PLOF: Needs assistance with ADLs, Needs assistance with gait, and Needs assistance with transfers  PATIENT GOALS To obtain a new manual wheelchair       MEDICAL HISTORY:  Primary diagnosis  onset: 1975/[birth year] Diagnosis  Code: G71.0 Diagnosis: muscular dystrophy   Diagnosis code:       Diagnosis:   Diagnosis  Code: G80.9 Diagnosis: cerebral palsy  '[]'$ Progressive disease  Relevant future surgeries: N/A    Height: 4' Weight: 56 lbs Explain recent changes or trends in weight:  N/A    History:  Past Medical History:  Diagnosis Date   Abnormal uterine bleeding (AUB)    Anemia    Bowel incontinence    Cleft lip    Surgery to repair   CP (cerebral palsy) (HCC)    GERD (gastroesophageal reflux disease)    Glaucoma    Hydronephrosis of right kidney    Hyperlipemia    Mental retardation    non verbal   Muscular dystrophy ([place])    Pneumonia 08/2015   Pre-diabetes    Seizures ([place])    6 years none since 10/1980- no longer on medication , no longer sees a neurologist-    Sepsis ([place])    Urine incontinence    Uterine fibroid    Wheelchair dependent             Cardio Status:  Functional Limitations: Limited by decreased ability to perform aerobic conditioning  '[]'$ Intact  '[x]'$  Impaired      Respiratory Status:  Functional Limitations:   '[x]'$ Intact  '[]'$ Impaired   '[]'$ SOB '[]'$ COPD '[]'$ O2 Dependent ______LPM  '[]'$ Ventilator Dependent  Resp equip:                                                     Objective Measure(s):   Orthotics: BLE AFOs  '[]'$ Amputee:                                                             '[]'$   Prosthesis:        HOME ENVIRONMENT:  '[]'$ House '[]'$ Condo/town home '[x]'$ Apartment '[]'$ Asst living '[]'$ LTCF         '[]'$ Own  '[]'$ Rent   '[]'$ Lives alone '[x]'$ Lives with others -         caregiver 24/7                    Hours without assistance: 0  '[x]'$ Home is accessible to patient                                 Storage of wheelchair:  '[x]'$ In home   '[]'$ Other Comments:        COMMUNITY :  TRANSPORTATION:  '[x]'$ Car '[]'$ Van '[]'$ Public Transportation '[]'$ Adapted w/c Lift '[]'$  Ambulance '[]'$ Other:                     '[]'$ Sits in wheelchair during transport   Where is w/c stored during transport? Trunk of sedan  '[]'$ [place] [place]  '[]'$  [employer] agent  r   '[]'$ Self-Driver       Drive while in  [employer] '[]'$ yes '[x]'$ no   Employment and/or school:  Specific requirements pertaining to mobility N/A       Other:  COMMUNICATION:  Verbal Communication  '[]'$ WFL '[]'$ receptive '[]'$ WFL '[]'$ expressive '[]'$ Understandable  '[]'$ Difficult to understand  '[x]'$ non-communicative  Primary Language:______________ 2nd:_____________  Communication provided by:'[]'$ Patient '[x]'$ Family '[x]'$ Caregiver '[]'$ Translator   '[]'$ Uses an augmentative communication device     Manufacturer/Model :                                                                MOBILITY/BALANCE:  Sitting Balance  Standing Balance  Transfers  Ambulation   '[x]'$ WFL      '[]'$ WFL  '[]'$ Independent  '[]'$  Independent   '[]'$ Uses UE for balance in sitting Comments:  '[]'$ Uses UE/device for stability Comments:  '[]'$  Min assist  '[]'$  Ambulates independently with       device:___________________      '[]'$  Mod assist  '[]'$  Able to ambulate ______ feet        safely/functionally/independently   '[]'$  Min assist  '[x]'$  Min assist  '[]'$  Max assist  '[]'$  Non-functional ambulator         History/High risk of falls   '[]'$  Mod assist  '[]'$  Mod assist  '[x]'$  Dependent  '[x]'$  Unable to ambulate   '[]'$  Max  assist  '[]'$  Max assist  Transfer method:r1 person r2 person rsliding board rsquat pivot rstand pivot rmechanical patient lift  rother: dependent lift due to patient size  '[]'$  Unable  '[]'$  Unable    Fall History: # of falls in the past 6 months? 0 # of "near" falls in the past 6 months? 0    CURRENT SEATING / MOBILITY:  Current Mobility Device: '[]'$ None '[]'$ Cane/Walker '[x]'$ Manual '[]'$ Dependent '[]'$ Dependent w/ Tilt rScooter '[]'$ Power (type of control):   Manufacturer: Convaid Model: Cruiser Serial #:   Size: 16"x14" Color: Red Age: 27 years  Purchased by whom: patient  Current condition of mobility base:  shows normal wear and tear for its' age  Current seating system:  Age of seating system:     Describe posture in present seating system: No postural deviations in current system   Is the current mobility meeting medical necessity?:  '[x]'$ Yes '[]'$ No Describe:                                     Ability to complete Mobility-Related Activities of Daily Living (MRADL's) with Current Mobility Device:   Move room to room  '[]'$ Independent  '[]'$ Min '[]'$ Mod '[]'$ Max assist  '[x]'$ Unable  Comments: Patient is dependent for all mobility.  Meal prep  '[]'$ Independent  '[]'$ Min '[]'$ Mod '[]'$ Max assist  '[x]'$ Unable    Feeding  '[]'$ Independent  '[x]'$ Min '[]'$ Mod '[]'$ Max assist  '[]'$ Unable    Bathing  '[]'$ Independent  '[]'$ Min '[]'$ Mod '[x]'$ Max assist  '[]'$ Unable    Grooming  '[]'$ Independent  '[]'$ Min '[]'$ Mod '[x]'$ Max assist  '[]'$ Unable    UE dressing  '[]'$ Independent  '[]'$ Min '[]'$ Mod '[x]'$ Max assist  '[]'$ Unable    LE dressing  '[]'$ Independent   '[]'$ Min '[]'$ Mod '[x]'$ Max assist  '[]'$ Unable    Toileting  '[]'$ Independent  '[]'$ Min '[]'$ Mod '[]'$ Max assist  '[x]'$ Unable    Bowel Mgt: '[]'$  Continent '[x]'$  Incontinent '[]'$  Accidents '[x]'$  Diapers '[]'$  Colostomy '[]'$  Bowel Program:  Bladder Mgt: '[]'$  Continent '[x]'$  Incontinent '[]'$  Accidents '[x]'$  Diapers '[]'$  Urinal '[]'$  Intermittent Cath '[]'$  Indwelling Cath '[]'$  Supra-pubic Cath     Current Mobility Equipment Trialed/ Ruled Out:    Does not meet mobility needs due to:    Mark all boxes that indicate inability to use the specific equipment listed     Meets needs for safe  independent functional  ambulation  / mobility    Risk of  Falling or History of Falls    Enviromental limitations      Cognition    Safety concerns with  physical ability    Decreased / limitations endurance  & strength     Decreased / limitations  motor skills  & coordination    Pain    Pace /  Speed    Cardiac and/or  respiratory condition    Contra - indicated by diagnosis   Cane/Crutches  '[]'$   '[x]'$   '[]'$   '[x]'$   '[x]'$   '[x]'$   '[x]'$   '[]'$   '[]'$   '[]'$   '[x]'$    Walker / Rollator  '[]'$  NA   '[]'$   '[x]'$   '[]'$   '[x]'$   '[x]'$   '[x]'$   '[x]'$   '[]'$   '[x]'$   '[x]'$   '[]'$     Manual Wheelchair [id number]:  '[]'$  NA  '[x]'$   '[]'$   '[]'$   '[]'$   '[]'$    '[]'$   '[]'$   '[]'$   '[]'$   '[]'$   '[]'$    Manual W/C (K0005) with power assist  '[x]'$  NA  '[]'$   '[]'$   '[]'$   '[]'$   '[]'$   '[]'$   '[]'$   '[]'$   '[]'$   '[]'$   '[]'$    Scooter  '[x]'$  NA  '[]'$   '[]'$   '[]'$   '[]'$   '[]'$   '[]'$   '[]'$   '[]'$   '[]'$   '[]'$   '[]'$    Power Wheelchair: standard joystick  '[x]'$  NA  '[]'$   '[]'$   '[]'$   '[]'$   '[]'$   '[]'$   '[]'$   '[]'$   '[]'$   '[]'$   '[]'$    Power Wheelchair: alternative controls  '[x]'$  NA  '[]'$   '[]'$   '[]'$   '[]'$   '[]'$   '[]'$   '[]'$   '[]'$   '[]'$   '[]'$   '[]'$    Summary:  The least costly alternative for independent functional mobility was found to be:    '[]'$   Crutch/Cane  '[]'$  Walker '[x]'$  Manual w/c  '[]'$  Manual w/c with power assist   '[]'$  Scooter   '[]'$  Power w/c std joystick   '[]'$  Power w/c alternative control        '[x]'$  Requires dependent care mobility device   Media planner for [employer] skills are adequate for safe mobility equipment operation  '[]'$   Yes '[x]'$   No  Patient is willing and motivated to use recommended mobility equipment  '[]'$   Yes '[x]'$   No       '[x]'$  Patient is unable to safely operate mobility equipment independently and requires dependent care equipment Comments: Patient has diagnosis of muscular dystrophy, cerebral palsy, and mental retardation and is therefore dependent in all of her care and for her mobility with use of a manual wheelchair.          SENSATION and SKIN ISSUES:  Sensation '[x]'$  Intact  '[]'$  Impaired '[]'$  Absent '[]'$  Hyposensate '[]'$  Hypersensate  '[]'$  Defensiveness  Location(s) of impairment:    Pressure Relief Method(s):  '[]'$  Lean side to side to offload (without risk of falling)  '[]'$   W/C push up (4+ times/hour for 15+ seconds) '[x]'$  Stand up (without risk of falling)    '[]'$  Other: (Describe): Effective pressure relief method(s) above can be performed consistently throughout the day: rYes  r No If not, Why?:  Skin Integrity Risk:       '[]'$  Low risk           '[x]'$  Moderate risk            '[]'$  High risk  If high risk, explain:   Skin Issues/Skin Integrity  Current skin Issues  '[]'$  Yes '[x]'$  No '[]'$  Intact  '[]'$   Red area   '[]'$   Open area  '[]'$  Scar  tissue  '[]'$  At risk from prolonged sitting  Where: History of Skin Issues  '[]'$  Yes '[x]'$  No Where : When: Stage: Hx of skin flap surgeries  '[]'$  Yes '[x]'$  No Where:  When:  Pain: '[]'$  Yes '[x]'$  No   Pain Location(s):  Intensity scale: (0-10) : How does pain interfere with mobility and/or MRADLs? -         MAT EVALUATION:  Neuro-Muscular Status: (Tone, Reflexive, Responses, etc.)     '[]'$   Intact   '[]'$  Spasticity:  '[]'$  Hypotonicity  '[x]'$  Fluctuating  '[x]'$  Muscle Spasms  '[]'$  Poor Righting Reactions/Poor Equilibrium Reactions  '[]'$  Primal Reflex(s):    Comments: history of muscle spasticity due to diagnosis of cerebral palsy           COMMENTS:    POSTURE:     Comments:  Pelvis Anterior/Posterior:  '[]'$  Neutral   '[x]'$  Posterior  '[]'$  Anterior  '[]'$  Fixed - No movement '[x]'$  Tendency away from neutral '[]'$  Flexible '[]'$  Self-correction '[]'$  External correction Obliquity (viewed from front)  '[x]'$  WFL '[]'$  R Obliquity '[]'$  L Obliquity  '[]'$  Fixed - No movement '[]'$  Tendency away from neutral '[]'$  Flexible '[]'$  Self-correction '[]'$  External correction Rotation  '[x]'$  WFL '[]'$  R anterior '[]'$  L anterior  '[]'$  Fixed - No movement '[]'$  Tendency away from neutral '[]'$  Flexible '[]'$  Self-correction '[]'$  External correction Tonal Influence Pelvis:  '[]'$  Normal '[]'$  Flaccid '[]'$  Low tone '[x]'$  Spasticity '[]'$  Dystonia '[]'$  Pelvis thrust '[]'$  Other:    Trunk Anterior/Posterior:  '[]'$  WFL '[x]'$  Thoracic kyphosis '[]'$  Lumbar lordosis  '[]'$  Fixed - No movement '[x]'$  Tendency away from neutral '[]'$  Flexible '[]'$  Self-correction '[]'$  External correction  '[x]'$  WFL '[]'$  Convex  to left  '[]'$  Convex to right '[]'$  S-curve   '[]'$  C-curve '[]'$  Multiple curves '[]'$  Tendency away from neutral '[]'$  Flexible '[]'$  Self-correction '[]'$  External correction Rotation of shoulders and upper trunk:  '[x]'$  Neutral '[]'$  Left-anterior '[]'$  Right- anterior '[]'$  Fixed- no movement '[]'$  Tendency away from neutral '[]'$  Flexible '[]'$  Self correction '[]'$  External correction Tonal influence  Trunk:  '[]'$  Normal '[]'$  Flaccid '[]'$  Low tone '[x]'$  Spasticity '[]'$  Dystonia '[]'$  Other:   Head & Neck  '[]'$  Functional '[x]'$  Flexed    '[]'$  Extended '[]'$  Rotated right  '[]'$  Rotated left '[]'$  Laterally flexed right '[]'$  Laterally flexed left '[]'$  Cervical hyperextension   '[]'$  Good head control '[]'$  Adequate head control '[x]'$  Limited head control '[]'$  Absent head control Describe tone/movement of head and neck: dystonia/chorea movements of the head     Lower Extremity Measurements: LE ROM:  Passive ROM Right 06/23/2022 Left 06/23/2022  Hip flexion [facility]  Hip extension    Hip abduction    Hip adduction    Knee flexion [facility] WFL  Knee extension [facility] WFL  Ankle dorsiflexion Decreased (PF contracture) Decreased (PF contracture)  Ankle plantarflexion     (Blank rows = not tested)  LE MMT:  MMT Right 06/23/2022 Left 06/23/2022  Hip flexion    Hip extension    Hip abduction    Hip adduction    Knee flexion    Knee extension    Ankle dorsiflexion    Ankle plantarflexion     (Blank rows = not tested)  Hip positions:  '[x]'$  Neutral   '[]'$  Abducted   '[]'$  Adducted  '[]'$  Subluxed   '[]'$  Dislocated   '[]'$  Fixed   '[]'$  Tendency away from neutral '[]'$  Flexible '[]'$  Self-correction '[]'$  External correction   Hip Windswept:'[x]'$  Neutral  '[]'$  Right    '[]'$  Left  '[]'$  Subluxed   '[]'$  Dislocated   '[]'$  Fixed   '[]'$  Tendency away from neutral '[]'$  Flexible '[]'$  Self-correction '[]'$  External correction  LE Tone: '[x]'$  Normal '[]'$  Low tone '[]'$  Spasticity '[]'$  Flaccid '[]'$  Dystonia '[]'$  Rocks/Extends at hip '[]'$  Thrust into knee extension '[]'$  Pushes legs downward into footrest  Foot positioning: ROM Concerns: Dorsiflexed: '[]'$  Right   '[]'$  Left Plantar flexed: '[x]'$  Right    '[x]'$  Left Inversion: '[]'$  Right    '[]'$  Left Eversion: '[]'$  Right    '[]'$  Left  LE Edema: '[]'$  1+ (Barely detectable impression when finger is pressed into skin) '[]'$  2+ (slight indentation. 15 seconds to rebound) '[]'$  3+ (deeper indentation. 30 seconds to rebound) '[]'$  4+ (>30 seconds to  rebound)  UE Measurements:  UPPER EXTREMITY ROM:   Passive ROM Right 06/23/2022 Left 06/23/2022  Shoulder flexion [facility]  Shoulder abduction [facility]  Shoulder adduction    Elbow flexion [facility] WFL  Elbow extension [facility]  Wrist flexion [facility] WFL  Wrist extension WFL WFL  (Blank rows = not tested)  UPPER EXTREMITY MMT:  MMT Right 06/23/2022 Left 06/23/2022  Shoulder flexion    Shoulder abduction    Shoulder adduction    Elbow flexion    Elbow extension    Wrist flexion    Wrist extension    Pinch strength    Grip strength    (Blank rows = not tested)  Shoulder Posture:  Right Tendency towards Left  '[]'$   Functional '[]'$    '[]'$   Elevation '[]'$    '[]'$   Depression '[]'$    '[x]'$   Protraction '[x]'$    '[]'$   Retraction '[]'$    '[]'$   Internal rotation '[]'$    '[]'$   External rotation '[]'$    '[]'$   Subluxed '[]'$     UE Tone: '[x]'$  Normal '[]'$  Flaccid '[]'$  Low tone '[]'$  Spasticity  '[]'$  Dystonia '[]'$  Other:   UE Edema: '[]'$  1+ (Barely detectable impression when finger is pressed into skin) '[]'$  2+ (slight indentation. 15 seconds to rebound) '[]'$  3+ (deeper indentation. 30 seconds to rebound) '[]'$  4+ (>30 seconds to rebound)  Wrist/Hand: Handedness: '[]'$  Right   '[]'$  Left   '[x]'$  NA: Comments:  Right  Left  '[]'$   WNL '[]'$    '[]'$   Limitations '[]'$    '[]'$   Contractures '[]'$    '[]'$   Fisting '[]'$    '[]'$   Tremors '[]'$    '[]'$   Weak grasp '[]'$    '[x]'$   Poor dexterity '[x]'$    '[]'$   Hand movement non functional '[]'$    '[]'$   Paralysis '[]'$         MOBILITY BASE RECOMMENDATIONS and JUSTIFICATION:  MOBILITY BASE  JUSTIFICATION   Manufacturer:   Convade Model: Metro                             Color:  Seat Width:  16" Seat Depth: 14"    '[x]'$  Manual mobility base (continue below)   '[]'$  Scooter/POV  '[]'$  Power mobility base   Number of hours per day spent in above selected mobility base: 12  Typical daily mobility base use Schedule: Patient spends at minimum 12 hours per day in her wheelchair. She is able to stand frequently throughout the day to perform pressure  relief but is non-ambulatory and therefore requires the manual wheelchair for dependent mobility. Due to her diagnoses of muscular dystrophy, cerebral palsy, and due to her significant cognitive deficits as well as her body habitus she is unable to propel a wheelchair independently and relies on caregivers to transport her dependently via wheelchair.   '[x]'$  is not a safe, functional ambulator  '[]'$  limitation prevents from completing a MRADL(s) within a reasonable time frame    '[]'$  limitation places at high risk of morbidity or mortality secondary to  the attempts to perform a    MRADL(s)  '[x]'$  limitation prevents accomplishing a MRADL(s) entirely  '[]'$  provide independent mobility  '[x]'$  equipment is a lifetime medical need  '[x]'$  walker or cane inadequate  '[x]'$  any type manual wheelchair      inadequate  '[]'$  scooter/POV inadequate      '[x]'$  requires dependent mobility          MANUAL MOBILITY      '[]'$  Standard manual wheelchair  K0001      Arm:    '[]'$  both '[]'$  right  '[]'$  left      Foot:   '[]'$  both '[]'$  right   '[]'$  left  '[]'$  self-propels wheelchair  '[]'$  will use on regular basis  '[]'$  chair fits throughout home  '[]'$  willing and motivated to use  '[]'$  propels with assistance     '[]'$  dependent use   '[]'$  Standard hemi-manual wheelchair  K0002      Arm:    '[]'$  both '[]'$  right  '[]'$  left      Foot:   '[]'$  both '[]'$  right   '[]'$  left  '[]'$  lower seat height required to foot      propel  '[]'$  short stature  '[]'$  self-propels wheelchair  '[]'$  will use on regular basis  '[]'$  chair fits throughout home  '[]'$  willing and motivated to use   '[]'$   propels with assistance  '[]'$  dependent use   '[]'$  Lightweight manual wheelchair  K0003      Arm:    '[]'$  both '[]'$  right  '[]'$  left      Foot:   '[]'$  both  '[]'$  right  '[]'$  left                   '[]'$  hemi height required  '[]'$  medical condition and weight of  wheelchair affect ability to self      propel standard manual wheelchair in the residence  '[]'$  can and does self-propel (marginal propulsion skills)  '[]'$  daily  use _________hours  '[]'$  chair fits throughout home  '[]'$  willing and motivated to use  '[]'$  lower seat height required to foot propel  '[]'$  short stature   '[]'$  High strength lightweight manual  wheelchair (Breezy Ultra 4)  K0004     Arm:    '[]'$  both '[]'$  right  '[]'$  left     Foot:   '[]'$  both '[]'$  right   '[]'$  left                                                                  '[]'$  hemi height required '[]'$  medical condition and weight of wheelchair affect ability to self propel while engaging in frequent MRADL(s) that cannot be performed in a standard or lightweight manual wheelchair  '[]'$  daily use _________hours  '[]'$  chair fits throughout home  '[]'$  willing and motivated to use  '[]'$  prevent repetitive use injuries   '[]'$  lower seat height required to foot propel  '[]'$  short stature    '[]'$  Ultra-lightweight manual wheelchair  K0005     Arm:    '[]'$  both '[]'$  right  '[]'$  left     Foot:   '[]'$  both '[]'$  right  '[]'$  left      '[]'$  '[]'$  hemi height required  '[]'$  heavy duty    Front seat to floor _____ inches      Rear seat to floor _____ inches      Back height _____ inches     Back angle ______ degrees      Front angle _____ degrees  '[]'$   full-time manual wheelchair user  '[]'$  Requires individualized fitting and optimal adjustments for multiple features that include adjustable axle configuration, fully adjustable center of gravity, wheel camber, seat and back angle, angle of seat slope, which cannot be accommodated by a K0001 through K0004 manual wheelchair  '[]'$  prevent repetitive use injuries  '[]'$  daily use_________hours   '[]'$  user has high activity patterns that frequently require  them  to go out into the community for the purpose of independently accomplishing high level MRADL activities. Examples of these might include a combination of; shopping, work, school, Science writer, childcare, independently loading and unloading from a vehicle etc.  '[]'$  lower seat height required to foot propel  '[]'$  short stature  '[]'$  heavy duty -  weight over  250lbs   '[]'$  Current chair is a K0005   manufacture:___________________  model:_________________  serial#____________________  age:_________    '[]'$  First time [id number] user (complete trial)  K0004 time and # of strokes to propel 30 feet: ________seconds _________strokes  [id number] time and # of strokes to propel 30 feet: ________seconds _________strokes  What  was the result of the trial between the K0004 and K0005 manual wheelchair? ___    What features of the K0005 w/c are needed as compared to the K0004 base? Why?___    '[]'$  adjustable seat and back angle changes the angle of seat slope of the frame to attain a gravity assisted position for efficient propulsion and proper weight distribution along the frame     '[]'$  the front of the wheelchair will be configured higher than the back of the chair to allow gravity to assist the user with postural stability  '[]'$  the center of the wheel will be positioned for stability, safety and efficient propulsion  '[]'$  adjustable axle allows for vertical, horizontal, camber and overall width changes  throughout the wheels for adjustment of the client's exact needs and abilities.   '[]'$  adjustable axle increases the stability and function of the chair allowing for adjustment of the center of gravity.   '[]'$  accommodates the client's anatomical position in the chair maximizing independence in mobility and maneuverability in all environments.   '[]'$  create a minimal fixed tilt-in space to assist in positioning.   '[]'$  Describe users full-time manual wheelchair activity patterns:___    '[]'$  Power assist Comments:  '[]'$  prevent repetitive use injuries  '[]'$  repetitive strain injury present in    shoulder girdle    '[]'$  shoulder pain is (> or =) to 7/10     during manual propulsion       Current Pain _____/10  '[]'$  requires conservation of energy to participate in MRADL(s) runable to propel up ramps or curbs using manual wheelchair  '[]'$  been K0005 user greater than one year  '[]'$  user  unwilling to use power      wheelchair (reason): '[]'$  less expensive option to power   wheelchair   '[]'$  rim activated power assist -      decreased strength   '[]'$  Heavy duty manual wheelchair       K0006     Arm:    '[]'$  both '[]'$  right  '[]'$  left     Foot:   '[]'$  both '[]'$  right  '[]'$  left     '[]'$  hemi height required    '[]'$  Dependent base  '[]'$  user exceeds 250lbs  '[]'$  non-functional ambulator    '[]'$  extreme spasticity  '[]'$  over active movement   '[]'$  broken frame/hx of repeated     repairs  '[]'$  able to self-propel in residence       '[]'$  lower seat to floor height required  '[]'$  unable to self-propel in residence   '[]'$  Extra heavy duty manual wheelchair  K0007     Arm:    '[]'$  both '[]'$  right  '[]'$  left     Foot:   '[]'$  both '[]'$  right  '[]'$  left     '[]'$  hemi height required  '[]'$  Dependent base  '[]'$  user exceeds 300lbs  '[]'$  non-functional ambulator    '[]'$  able to self-propel in residence   '[]'$  lower seat to floor height required  '[]'$  unable to self-propel in residence     '[]'$  Manual wheelchair with tilt [phone]      (Manual "Tilt-n-Space")  '[]'$  patient is dependent for transfers  '[]'$  patient requires frequent       positioning for pressure relief   '[]'$  patient requires frequent      positioning for poor/absent trunk control        '[x]'$  Stroller Base  '[]'$  infant/child   '[x]'$  unable to propel manual  wheelchair  '[]'$  allows for growth  '[x]'$  non-functional ambulator  '[x]'$  non-functional UE  '[x]'$  independent mobility is not a goal at this time    [place] handles  '[]'$  extended  rangle adjustable   '[]'$  standard  '[]'$  caregiver access  '[]'$  caregiver assist    '[]'$  allows "hooking" to enable      increased ability to perform ADLs or maintain balance   '[]'$  Angle Adjustable Back  '[]'$  postural control  '[]'$  control of tone/spasticity  '[]'$  accommodation of range of motion  '[]'$  UE functional control  '[]'$  accommodation for seating system    Rear wheel placement  '[]'$  std/fixed rfully adjustableramputee   '[]'$  camber ________degree   '[]'$  removable rear wheel  '[]'$  non-removable rear wheel  Wheel size _______  Wheel style_______________________  '[]'$  improved UE access to wheels  '[]'$  increase propulsion ability  '[]'$  improved stability  '[]'$  changing angle in space for      improvement of postural stability  '[]'$  remove for transport    '[]'$  allow for seating system to fit on      base  '[]'$  amputee placement  '[]'$  1-arm drive access   r R  r L  '[]'$  enable propulsion of manual       wheelchair with one arm    '[]'$  amputee placement   Wheel rims/ Hand rims  '[]'$  Standard    '[]'$  Specialized-____ '[]'$  provide ability to propel manual   '[]'$  increase self-propulsion with hand wheelchair weakness/decreased grasp     '[]'$  Spoke protector/guard   '[]'$  prevent hands from getting caught in spokes   Tires:  '[]'$  pneumatic  '[]'$  flat free inserts  '[x]'$  solid  Style:  12.5" rear solid tire '[]'$  decrease roll resistance              '[x]'$  prevent frequent flats  '[]'$  increase shock absorbency  '[x]'$  decrease maintenance   '[]'$  decrease pain from road shock    '[]'$  decrease spasms from road shock    Wheel Locks:    '[]'$  push '[]'$  pull '[]'$  scissor  '[]'$  lock wheels for transfers  '[]'$  lock wheels from rolling   Brake/wheel lock extension:  '[]'$  R  '[]'$  L  '[]'$  allow user to operate wheel locks due to decreased reach or strength   Caster housing:  Caster size:                      Style:                                          '[]'$  suspension fork  '[]'$  maneuverability   '[]'$  stability of wheelchair   '[]'$  durability  '[]'$  maintenance  '[]'$  angle adjustment for posture  '[]'$  allow for feet to come under        wheelchair base  '[]'$  allows change in seat to floor      height   '[]'$  increase shock absorbency  '[]'$  decrease pain from road shock  '[]'$  decrease spasms from road    shock   '[]'$  Side guards  '[]'$  prevent clothing getting caught in      wheel or becoming soiled  rprovide hip and pelvic stability  '[]'$  eliminates contact between body and wheels  '[]'$  limit hand contact with wheels   '[]'$   Anti-tippers      '[]'$   prevent wheelchair from tipping    backward  '[]'$  assist caregiver with curbs     POWER MOBILITY      '[]'$  Scooter/POV    '[]'$  can safely operate   '[]'$  can safely transfer   '[]'$  has adequate trunk stability   '[]'$  cannot functionally propel  manual wheelchair    '[]'$  Power mobility base    '[]'$  non-ambulatory   '[]'$  cannot functionally propel manual wheelchair   '[]'$  cannot functionally and safely      operate scooter/POV  '[]'$  can safely operate power       wheelchair  '[]'$  home is accessible  '[]'$  willing to use power wheelchair     Tilt  '[]'$  Powered tilt on powered chair  '[]'$  Powered tilt on manual chair '[]'$  Manual tilt on manual chair Comments:  '[]'$  change position for pressure      '[]'$  elief/cannot weight shift   '[]'$  change position against      gravitational force on head and      shoulders   '[]'$  decrease pain  '[]'$  blood pressure management   '[]'$  control autonomic dysreflexia  '[]'$  decrease respiratory distress  '[]'$  management of spasticity  '[]'$  management of low tone  '[]'$  facilitate postural control   '[]'$  rest periods   '[]'$  control edema  '[]'$  increase sitting tolerance   '[]'$  aid with transfers     Recline   '[]'$  Power recline on power chair  '[]'$  Manual recline on manual chair  Comments:    '[]'$  intermittent catheterization  '[]'$  manage spasticity  '[]'$  accommodate femur to back angle  '[]'$  change position for pressure relief/cannot weight shift rhigh risk of pressure sore development  '[]'$  tilt alone does not accomplish     effective pressure relief, maximum pressure relief achieved at -     _______ degrees tilt       _______ degrees recline   '[]'$  difficult to transfer to and from bed '[]'$  rest periods and sleeping in chair  '[]'$  repositioning for transfers  '[]'$  bring to full recline for ADL care  '[]'$  clothing/diaper changes in chair  '[]'$  gravity PEG tube feeding  '[]'$  head positioning  '[]'$  decrease pain  '[]'$  blood pressure management   '[]'$  control autonomic dysreflexia  '[]'$  decrease respiratory distress   '[]'$  user on ventilator     Elevator on mobility base  '[]'$  Power wheelchair  '[]'$  Scooter  '[]'$  increase Indep in transfers   '[]'$  increase Indep in ADLs    '[]'$  bathroom function and safety  '[]'$  kitchen/cooking function and safety  '[]'$  shopping  '[]'$  raise height for communication at standing level  '[]'$  raise height for eye contact which reduces cervical neck strain and pain  '[]'$  drive at raised height for safety and navigating crowds  '[]'$  Other:   '[]'$  Vertical position system (anterior tilt)     (Drive locks-out)    '[]'$  Stand       (Drive enabled)  '[]'$  independent weight bearing  '[]'$  decrease joint contractures  '[]'$  decrease/manage spasticity  '[]'$  decrease/manage spasms  '[]'$  pressure distribution away from   scapula, sacrum, coccyx, and ischial tuberosity  '[]'$  increase digestion and elimination   '[]'$  access to counters and cabinets  '[]'$  increase reach  '[]'$  increase interaction with others at eye level, reduces neck strain  '[]'$  increase performance of       MRADL(s)      Power elevating legrest    '[]'$  Center mount (Single) 85-170 degrees       '[]'$   Standard (Pair) 100-170 degrees  '[]'$  position legs at 90 degrees, not available with std power ELR  '[]'$  center mount tucks into chair to decrease turning radius in home, not available with std power ELR  '[]'$  provide change in position for LE  '[]'$  elevate legs during recline    '[]'$  maintain placement of feet on      footplate  '[]'$  decrease edema  '[]'$  improve circulation  '[]'$  actuator needed to elevate legrest  '[]'$  actuator needed to articulate legrest preventing knees from flexing  '[]'$  Increase ground clearance over      curbs  '[]'$   STD (pair) independently                     elevate legrest   POWER WHEELCHAIR CONTROLS      Controls/input device  '[]'$  Expandable  '[]'$  Non-expandable  '[]'$  Proportional  '[]'$  Right Hand '[]'$  Left Hand  '[]'$  Non-proportional/switches/head-array  '[]'$  Electrical/proximity         '[]'$   Mechanical      Manufacturer:___________________    Type:________________________ '[]'$  provides access for controlling wheelchair  '[]'$  programming for accurate control  '[]'$  progressive disease/changing condition  '[]'$  required for alternative drive      controls       '[]'$  lacks motor control to operate  proportional drive control  '[]'$  unable to understand proportional controls  '[]'$  limited movement/strength  '[]'$  extraneous movement / tremors / ataxic / spastic       '[]'$  Upgraded electronics      controller/harness    '[]'$  Single power (tilt or recline)   '[]'$  Expandable    '[]'$  Non-expandable plus   '[]'$  Multi-power (tilt, recline, power legrest, power seat lift, vertical positioning system, stand)  '[]'$  allows input device to communicate with drive motors  '[]'$  harness provides necessary     connections between the controller, input device, and seat functions     '[]'$  needed in order to operate   power seat functions through    joystick/ input device  '[]'$  required for alternative drive      controls     '[]'$  Enhanced display  '[]'$  required to connect all alternative drive controls   '[]'$  required for upgraded joystick      (lite-throw, heavy duty, micro)  '[]'$  Allows user to see in which mode and drive the wheelchair is set; necessary for alternate controls       '[]'$  Upgraded tracking electronics  '[]'$  correct tracking when on uneven surfaces makes switch driving more efficient and less fatiguing  '[]'$  increase safety when driving  '[]'$  increase ability to traverse      thresholds    '[]'$  Safety / reset / mode switches     Type:    '[]'$  Used to change modes and stop the wheelchair when driving     '[]'$  Mount for joystick / input device/switches  '[]'$  swing away for access or      transfers   '[]'$  attaches joystick / input device / switches to wheelchair   '[]'$  provides for consistent access  '[]'$  midline for optimal placement    '[]'$  Attendant controlled joystick plus     mount  '[]'$  safety  '[]'$  long distance driving  '[]'$  operation of seat functions  '[]'$  compliance with transportation  regulations    '[]'$  Battery  '[]'$  required to power (power assist / scooter/ power wc / other):       '[]'$  Power inverter (24V to 12V)  '[]'$   required for ventilator / respiratory equipment / other:     [place]   '[]'$  adjustable height rremovable  '[]'$  swing away '[]'$  fixed  '[]'$  flip back  '[]'$  reclining  '[]'$  full length pads '[]'$  desk '[]'$  tube arms '[]'$  gel pads  '[]'$  provide support with elbow at 90    '[]'$  remove/flip back/swing away for  transfers  '[]'$  provide support and positioning of upper body    '[]'$  allow to come closer to table top  '[]'$  remove for access to tables  '[]'$  provide support for w/c tray  '[]'$  change of height/angles for       variable activities   '[]'$  Elbow support / Elbow stop  '[]'$  keep elbow positioned on arm pad  '[]'$  keep arms from falling off arm pad      during tilt and/or recline   Upper Extremity Support  '[]'$  Arm trough  '[]'$   R  '[]'$   L  Style:  '[]'$  swivel mount '[]'$  fixed mount   '[]'$  posterior hand support  '[]'$   tray  '[]'$  full tray  '[]'$  joystick cut out  '[]'$   R  '[]'$   L  Style:  '[]'$  decrease gravitational pull on      shoulders  '[]'$  provide support to increase UE  function  '[]'$  provide hand support in natural    position  '[]'$  position flaccid UE  '[]'$  decrease subluxation    rdecrease edema       '[]'$  manage spasticity   '[]'$  provide midline positioning  '[]'$  provide work surface  '[]'$  placement for AAC/ Computer/ EADL       Hangers/ Legrests   '[]'$  ______ degree  '[]'$  Elevating '[]'$  articulating  '[]'$  swing away '[]'$  fixed '[]'$  lift off  '[]'$  heavy duty '[]'$  adjustable knee angle  '[]'$  adjustable calf panel   '[]'$  longer extension tube              '[]'$  provide LE support  '[]'$  maintain placement of feet on      footplate   '[]'$  accommodate lower leg length  '[]'$  accommodate to hamstring       tightness  '[]'$  enable transfers  '[]'$  provide change in position for LE's  '[]'$  elevate legs during recline    '[]'$  decrease edema  '[]'$  durability      Foot support   '[]'$  footplate '[]'$  R '[]'$  L '[]'$  flip up            '[]'$  Depth adjustable   '[]'$  angle adjustable  '[]'$  foot board/one piece    '[]'$  provide foot support  '[]'$  accommodate to ankle ROM  '[]'$  allow foot to go under wheelchair base  '[]'$  enable transfers     '[]'$  Shoe holders  '[]'$  position foot    '[]'$  decrease / manage spasticity  '[]'$  control position of LE  '[]'$  stability    '[]'$  safety     '[]'$  Ankle strap/heel      loops  '[]'$  support foot on foot support  '[]'$  decrease extraneous movement  '[]'$  provide input to heel   '[]'$  protect foot     '[]'$  Amputee adapter '[]'$  R  '[]'$  L     Style:                  Size:  '[]'$  Provide support for stump/residual extremity    '[]'$  Transportation tie-down  '[]'$  to provide crash tested tie-down brackets    '[]'$   Crutch/cane holder    '[]'$  O2 holder    '[]'$  IV hanger   '[]'$  Ventilator tray/mount    '[]'$  stabilize accessory on wheelchair       Component  Justification     '[]'$  Seat cushion      '[]'$  accommodate impaired sensation  '[]'$  decubitus ulcers present or history  '[]'$  unable to shift weight  '[]'$  increase pressure distribution  '[]'$  prevent pelvic extension  '[]'$  custom required "off-the-shelf"    seat cushion will not accommodate deformity  '[]'$  stabilize/promote pelvis alignment  '[]'$  stabilize/promote femur alignment  '[]'$  accommodate obliquity  '[]'$  accommodate multiple deformity  '[]'$  incontinent/accidents  '[]'$  low maintenance     '[]'$  seat mounts                 '[]'$  fixed '[]'$  removable  '[]'$  attach seat platform/cushion to wheelchair frame    '[]'$  Seat wedge    '[]'$  provide increased aggressiveness of seat shape to decrease sliding  down in the seat  '[]'$  accommodate ROM        '[]'$  Cover replacement   '[]'$  protect back or seat cushion  '[]'$  incontinent/accidents    '[]'$  Solid seat / insert    '[]'$  support cushion to prevent      hammocking  '[]'$  allows attachment of cushion to mobility base    '[]'$  Lateral pelvic/thigh/hip     support ([place])     '[]'$  decrease abduction  '[]'$  accommodate pelvis  '[]'$  position upper legs  '[]'$  accommodate spasticity  '[]'$  removable for transfers      '[]'$  Lateral pelvic/thigh      supports mounts  '[]'$  fixed   '[]'$  swing-away   '[]'$  removable  '[]'$  mounts lateral pelvic/thigh supports     '[]'$  mounts lateral pelvic/thigh supports swing-away or removable for transfers    '[]'$  Medial thigh support ([place])  rdecrease adduction raccommodate ROM  rremove for transfers    ralignment      '[]'$  Medial thigh   '[]'$  fixed      support mounts      '[]'$  swing-away   '[]'$  removable  '[]'$  mounts medial thigh supports   '[]'$  Mounts medial supports swing- away or removable for transfers       Component  Justification   '[]'$  Back       '[]'$  provide posterior trunk support '[]'$  facilitate tone  '[]'$  provide lumbar/sacral support '[]'$  accommodate deformity  '[]'$  support trunk in midline   '[]'$  custom required "off-the-shelf" back support will not accommodate deformity   '[]'$  provide lateral trunk support '[]'$  accommodate or decrease tone            '[]'$  Back mounts  '[]'$  fixed  '[]'$  removable  '[]'$  attach back rest/cushion to wheelchair frame   '[]'$  Lateral trunk      supports  '[]'$  R '[]'$  L  '[]'$  decrease lateral trunk leaning  '[]'$  accommodate asymmetry    '[]'$  contour for increased contact  '[]'$  safety    '[]'$  control of tone    '[]'$  Lateral trunk      supports mounts  '[]'$  fixed  '[]'$  swing-away   '[]'$  removable  '[]'$  mounts lateral trunk supports     '[]'$  Mounts lateral trunk supports swing-away or removable for transfers   '[]'$  Anterior chest      strap, vest     '[]'$  decrease forward movement of shoulder  '[]'$  decrease forward movement of trunk  '[]'$  safety/stability  '[]'$  added abdominal support  '[]'$   trunk alignment  '[]'$  assistance with shoulder control   '[]'$  decrease shoulder elevation    '[x]'$  Headrest    Headrest extension piece   '[x]'$  provide posterior head support  '[x]'$  provide posterior neck support  '[]'$  provide lateral head support  '[]'$  provide anterior head support  '[]'$  support during tilt and recline  '[]'$  improve feeding     '[]'$  improve respiration  '[]'$  placement of switches  '[]'$  safety    '[]'$  accommodate ROM    '[]'$  accommodate tone  '[]'$  improve visual orientation   '[]'$  Headrest           '[]'$  fixed '[]'$  removable '[]'$  flip down      Mounting hardware   '[]'$  swing-away laterals/switches  '[]'$  mount headrest   '[]'$  mounts headrest flip down or  removable for transfers  '[]'$  mount headrest swing-away laterals   '[]'$  mount switches     '[]'$  Neck Support    '[]'$  decrease neck rotation  '[]'$  decrease forward neck flexion   Pelvic Positioner    '[x]'$  std hip belt          '[]'$  padded hip belt  '[]'$  dual pull hip belt  '[]'$  four point hip belt  '[]'$  stabilize tone  '[x]'$  decrease falling out of chair  '[]'$  prevent excessive extension  '[]'$  special pull angle to control      rotation  '[]'$  pad for protection over boney      prominence  '[]'$  promote comfort    '[]'$  Essential needs        bag/pouch   '[]'$  medicines '[]'$  special food rorthotics '[]'$  clothing changes '[]'$  diapers   '[]'$  catheter/hygiene '[]'$  ostomy supplies   The above equipment has a life- long use expectancy.  Growth and changes in medical and/or functional conditions would be the exceptions.   SUMMARY:  Why mobility device was selected; include why a lower level device is not appropriate:   ASSESSMENT:  CLINICAL IMPRESSION: Patient is a 46 y.o. female who was seen today for physical therapy evaluation and treatment for evaluation for a new manual wheelchair. She is dependent for all mobility and is non-ambulatory and therefore requires a custom manual wheelchair with stroller base to allow caregivers to safely assist her with dependent mobility.   OBJECTIVE IMPAIRMENTS cardiopulmonary status limiting activity, decreased activity tolerance, decreased balance, decreased cognition, decreased coordination, decreased endurance, decreased mobility, difficulty walking, decreased safety awareness, increased muscle spasms, impaired tone, impaired UE functional use, improper body mechanics, and postural dysfunction.   ACTIVITY LIMITATIONS carrying, lifting, bending, sitting, standing, squatting,  stairs, transfers, bed mobility, continence, bathing, toileting, dressing, self feeding, reach over head, and hygiene/grooming  PARTICIPATION LIMITATIONS: meal prep, cleaning, laundry, medication management, personal finances, interpersonal relationship, driving, shopping, and community activity  PERSONAL FACTORS Time since onset of injury/illness/exacerbation and 1-2 comorbidities:    anemia, CP, GERD, glaucoma, mental retardation, muscular dystrophy, pre-diabetesare also affecting patient's functional outcome.   REHAB POTENTIAL: Good  CLINICAL DECISION MAKING: Stable/uncomplicated  EVALUATION COMPLEXITY: High                                   GOALS: One time visit. No goals established.    PLAN: PT FREQUENCY: one time visit    Excell Seltzer, PT, DPT, CSRS 06/23/2022, 12:09 PM    I concur with the above findings and recommendations of the therapist:  Physician name printed:  Physician's signature:      Date:

## 2022-06-29 IMAGING — DX DG FOOT COMPLETE 3+V*L*
3 series · 3 of 3 positions shown · non-contrast
Comparison: None Available.

CLINICAL DATA: Left foot pain.

EXAM:
LEFT FOOT - COMPLETE 3+ VIEW

[foot lat]
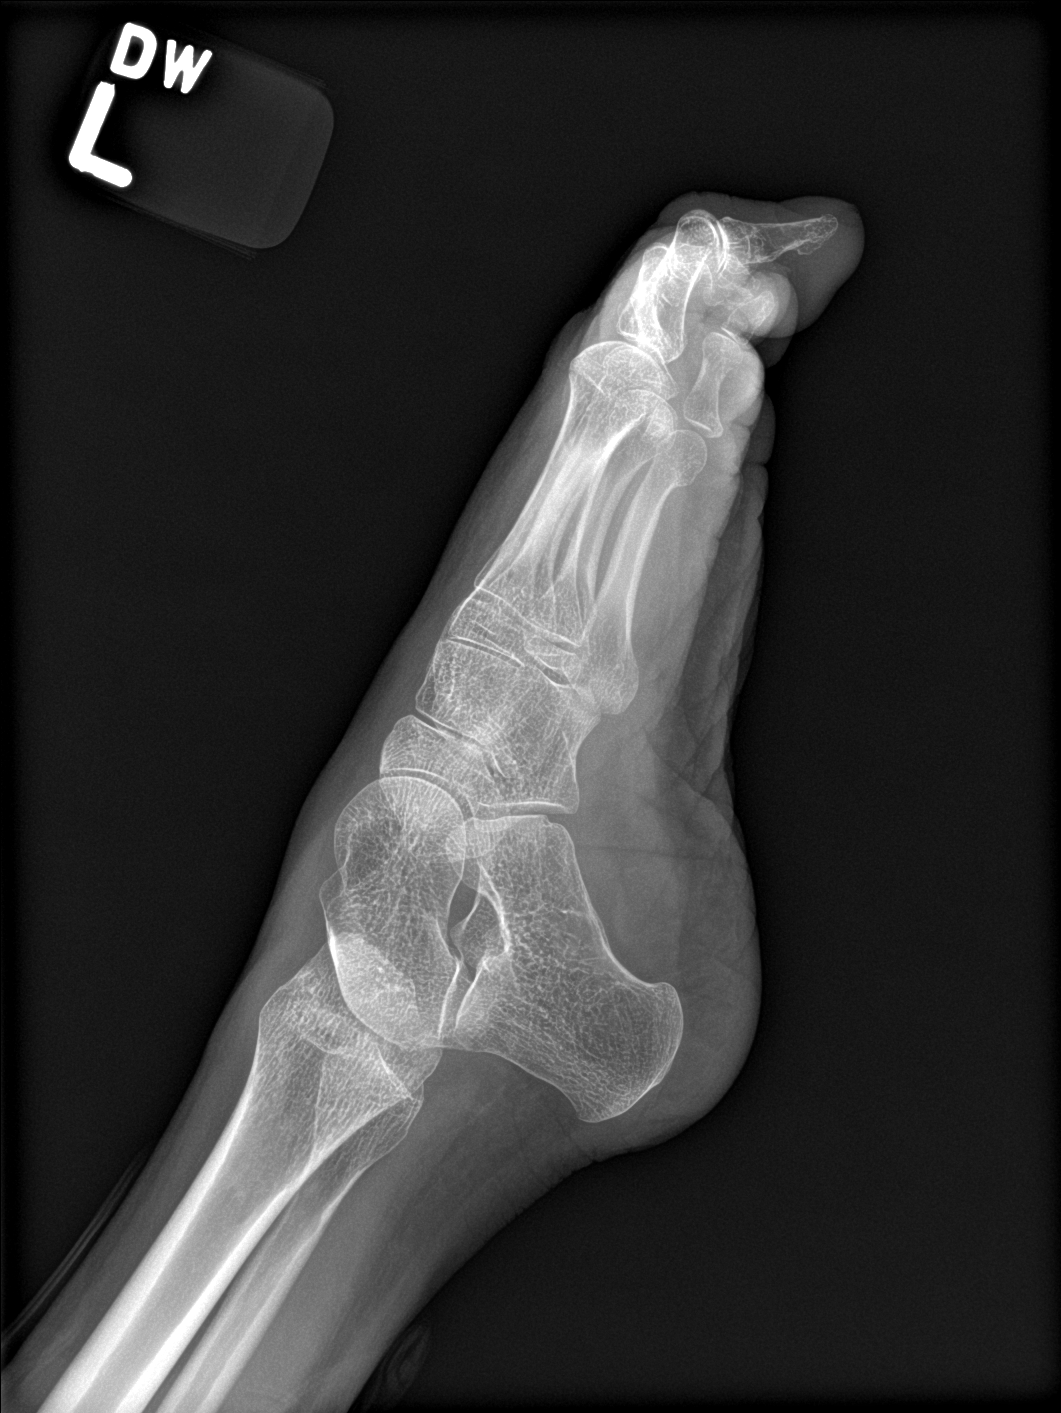

[foot obl]
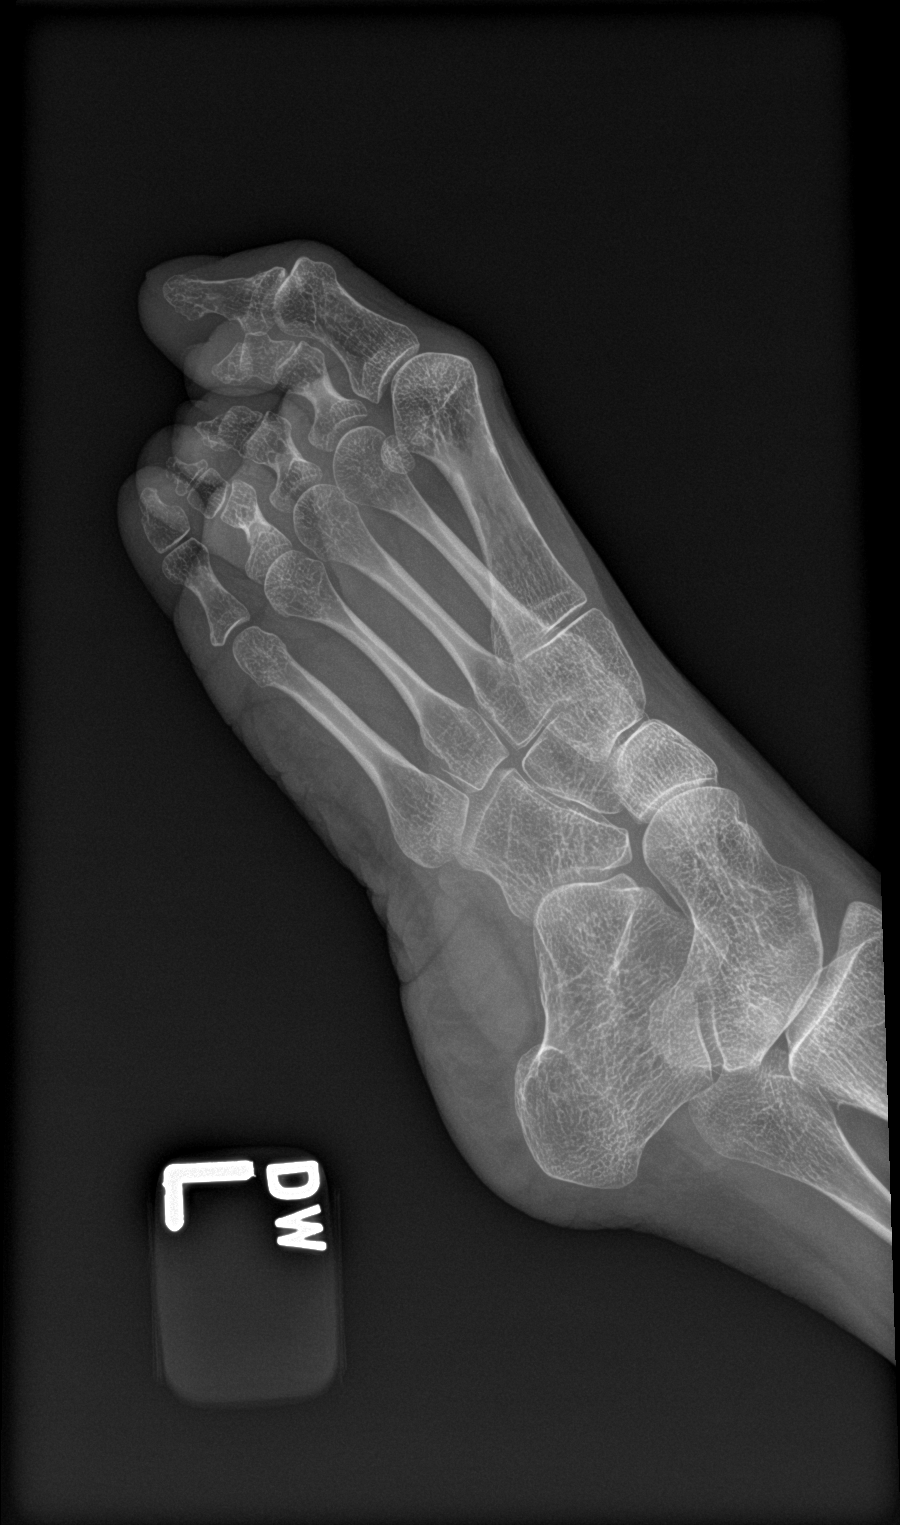

[foot ap]
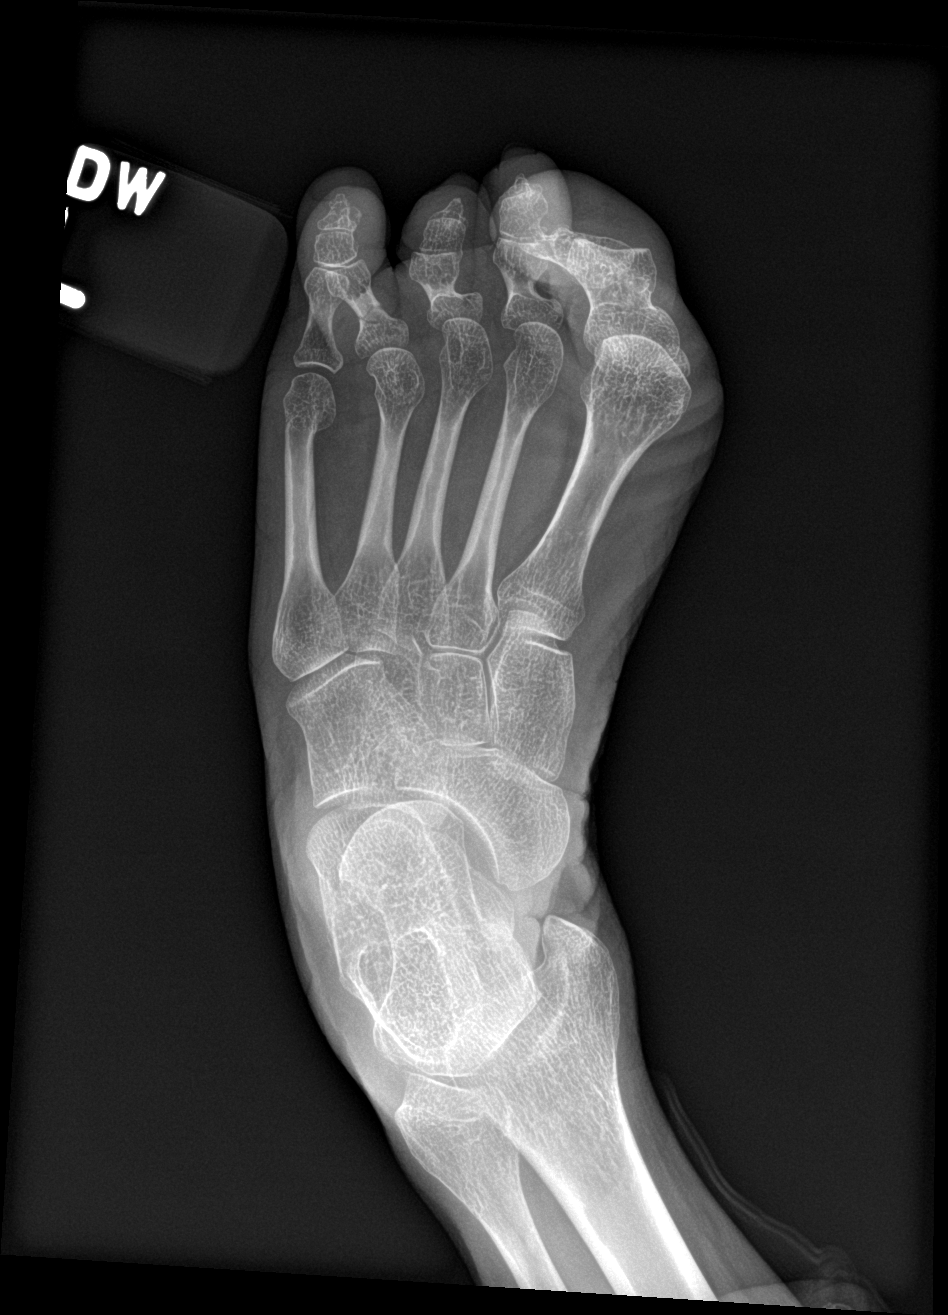

[3 of 3 positions shown; findings below may reference images not displayed]

FINDINGS: Possible pes planus on these nonweightbearing views. Abnormal
alignment of the hindfoot and midfoot, presumably chronic. Hammertoe
deformity of the toes. The bones are under mineralized with a
gracile appearance.

There is a transverse nondisplaced fracture of the fourth toe
proximal phalanx. No intra-articular extension. No other fracture.
No bony destruction or erosive change. No focal soft tissue
abnormalities.
IMPRESSION: 1. Nondisplaced fourth toe proximal phalanx fracture.
2. Chronic hammertoe deformity of the digits and abnormal
hindfoot/midfoot alignment.

## 2022-10-18 ENCOUNTER — Encounter: Payer: Self-pay | Admitting: Family Medicine

## 2022-10-18 ENCOUNTER — Other Ambulatory Visit: Payer: Self-pay | Admitting: Family Medicine

## 2022-10-18 DIAGNOSIS — N1330 Unspecified hydronephrosis: Secondary | ICD-10-CM

## 2022-10-18 DIAGNOSIS — R7401 Elevation of levels of liver transaminase levels: Secondary | ICD-10-CM

## 2023-01-06 ENCOUNTER — Ambulatory Visit: Payer: Medicare Other | Attending: Family Medicine

## 2023-01-06 DIAGNOSIS — R262 Difficulty in walking, not elsewhere classified: Secondary | ICD-10-CM | POA: Diagnosis present

## 2023-01-06 DIAGNOSIS — R293 Abnormal posture: Secondary | ICD-10-CM | POA: Insufficient documentation

## 2023-01-06 DIAGNOSIS — M6281 Muscle weakness (generalized): Secondary | ICD-10-CM | POA: Diagnosis not present

## 2023-01-06 NOTE — Therapy (Signed)
OUTPATIENT PHYSICAL THERAPY WHEELCHAIR EVALUATION   Patient Name: Maria [name] MRN: [id number] DOB:July 08, 1976, 47 y.o., female Today's Date: 01/06/2023   PT End of Session - 01/06/23 0835     Visit Number 1    Number of Visits 1    Authorization Type Medicare    PT Start Time 0843    PT Stop Time 0905    PT Time Calculation (min) 22 min    Activity Tolerance Patient tolerated treatment well;No increased pain    Behavior During Therapy WFL for tasks assessed/performed             Past Medical History:  Diagnosis Date   Abnormal uterine bleeding (AUB)    Anemia    Bowel incontinence    Cleft lip    Surgery to repair   CP (cerebral palsy) (HCC)    GERD (gastroesophageal reflux disease)    Glaucoma    Hydronephrosis of right kidney    Hyperlipemia    Mental retardation    non verbal   Muscular dystrophy (HCC)    Pneumonia 08/2015   Pre-diabetes    Seizures (HCC)    6 years none since 10/1980- no longer on medication , no longer sees a neurologist-    Sepsis (HCC)    Urine incontinence    Uterine fibroid    Wheelchair dependent    Past Surgical History:  Procedure Laterality Date   ABDOMINAL HYSTERECTOMY  09/08/2020   CLEFT PALATE REPAIR     CYSTOSCOPY WITH RETROGRADE PYELOGRAM, URETEROSCOPY AND STENT PLACEMENT N/A 09/08/2020   Procedure: CYSTOSCOPY WITH BILATERAL RETROGRADE PYELOGRAMS, BILATERAL STENT PLACEMENT;  Surgeon: Jerilee Field, MD;  Location: [facility] OR;  Service: Urology;  Laterality: N/A;   DILATATION & CURETTAGE/HYSTEROSCOPY WITH MYOSURE N/A 03/31/2020   Procedure: DILATATION & CURETTAGE WITH ENDOMETRIAL BIOPSY;  Surgeon: Edwinna Areola, DO;  Location: [facility] OR;  Service: Gynecology;  Laterality: N/A;   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   HYSTERECTOMY ABDOMINAL WITH SALPINGECTOMY Bilateral 09/08/2020   Procedure: TOTAL ABDOMINAL HYSTERECTOMY WITH BILATERAL SALPINGECTOMY;  Surgeon: Edwinna Areola, DO;  Location: [facility] OR;  Service: Gynecology;  Laterality: Bilateral;   MULTIPLE TOOTH EXTRACTIONS     NASAL SEPTUM SURGERY     lifted up   OPERATIVE ULTRASOUND N/A 03/31/2020   Procedure: OPERATIVE ULTRASOUND;  Surgeon: Edwinna Areola, DO;  Location: [facility] OR;  Service: Gynecology;  Laterality: N/A;   RADIOLOGY WITH ANESTHESIA N/A 05/28/2020   Procedure: MRI PELVIS AND ABDOMEN WITH AND WITHOUT CONTRAST;  Surgeon: Radiologist, Medication, MD;  Location: [facility] OR;  Service: Radiology;  Laterality: N/A;   STENT REMOVAL Bilateral 09/08/2020   Procedure: BILATERAL URETERAL STENT REMOVAL;  Surgeon: Edwinna Areola, DO;  Location: [facility] OR;  Service: Gynecology;  Laterality: Bilateral;   Patient Active Problem List   Diagnosis Date Noted   Secondary dysmenorrhea 09/08/2020   Rash 10/09/2017   Cerebral palsy 10/09/2017   Bacteria in urine 10/09/2017   Lactic acidosis 10/09/2017   Pneumonia 10/07/2017   Respiratory failure, acute 08/28/2015   Pyrexia    Difficult intravenous access    Sepsis 08/27/2015   Mental retardation 08/27/2015   CAP (community acquired pneumonia) 08/27/2015    PCP: Irena Reichmann, DO   REFERRING PROVIDER: Irena Reichmann, DO   THERAPY DIAG:  Muscle weakness (generalized)  Difficulty in walking, not elsewhere classified  Abnormal posture  Rationale for Evaluation and Treatment Habilitation  SUBJECTIVE:                                                                                                                                                                                           SUBJECTIVE STATEMENT: Pt present for wheelchair evaluation. She is accompanied by her mother and additional family member.   PRECAUTIONS: Fall  WEIGHT BEARING RESTRICTIONS No   OCCUPATION: N/A  PLOF: Needs assistance with ADLs, Needs assistance with gait, and Needs assistance with transfers  PATIENT GOALS To obtain a new manual wheelchair       MEDICAL  HISTORY:  Primary diagnosis onset: 1976-02-04 Diagnosis  Code: G71.0 Diagnosis: muscular dystrophy   Diagnosis code:       Diagnosis:   Diagnosis  Code: G80.9 Diagnosis: cerebral palsy  Progressive disease  Relevant future surgeries: N/A    Height: 4' Weight: 56 lbs Explain recent changes or trends in weight:  N/A    History:  Past Medical History:  Diagnosis Date   Abnormal uterine bleeding (AUB)    Anemia    Bowel incontinence    Cleft lip    Surgery to repair   CP (cerebral palsy) (HCC)    GERD (gastroesophageal reflux disease)    Glaucoma    Hydronephrosis of right kidney    Hyperlipemia    Mental retardation    non verbal   Muscular dystrophy (HCC)    Pneumonia 08/2015   Pre-diabetes    Seizures (HCC)    6 years none since 10/1980- no longer on medication , no longer sees a neurologist-    Sepsis (HCC)    Urine incontinence    Uterine fibroid    Wheelchair dependent             Cardio Status:  Functional Limitations: Limited by decreased ability to perform aerobic conditioning  Intact   Impaired      Respiratory Status:  Functional Limitations:   Intact  Impaired   SOB COPD O2 Dependent ______LPM  Ventilator Dependent  Resp equip:                                                     Objective Measure(s):   Orthotics: BLE AFOs  Amputee:                                                               Prosthesis:      HOME ENVIRONMENT:  [] House [] Condo/town home [x] Apartment [] Asst living [] LTCF         [] Own  [] Rent   [] Lives alone [x] Lives with others -         caregiver 24/7                    Hours without assistance: 0  [x] Home is accessible to patient                                 Storage of wheelchair:  [x] In home   [] Other Comments:       COMMUNITY :  TRANSPORTATION:  [x] Car [] Van [x] Public Transportation [] Adapted w/c Lift []  Ambulance [] Other:                     [] Sits in wheelchair during transport   Where is w/c stored during  transport? Trunk of sedan [x] Tie Downs  []  EZ Lock  r   [] Self-Driver       Drive while in  Biomedical scientist [] yes [x] no   Employment and/or school:  Specific requirements pertaining to mobility N/A       Other:  COMMUNICATION:  Verbal Communication  [] WFL [] receptive [] WFL [] expressive [] Understandable  [] Difficult to understand  [x] non-communicative  Primary Language:______________ 2nd:_____________  Communication provided by:[] Patient [x] Family [x] Caregiver [] Translator   [] Uses an Paramedic device     Manufacturer/Model :    MOBILITY/BALANCE:  Sitting Balance  Standing Balance  Transfers  Ambulation   [x] WFL      [] WFL  [] Independent  []  Independent   [] Uses UE for balance in sitting Comments:  [] Uses UE/device for stability Comments:  []  Min assist  []  Ambulates independently with       device:___________________      []  Mod assist  []  Able to ambulate ______ feet        safely/functionally/independently   []  Min assist  [x]  Min assist  []  Max assist  [x]  Non-functional ambulator         History/High risk of falls   []  Mod assist  []  Mod assist  [x]  Dependent  []  Unable to ambulate   []  Max  assist  []  Max assist  Transfer method:r1 person r2 person rsliding board rsquat pivot rstand pivot rmechanical patient lift  rother: dependent lift due to patient size  []  Unable  []  Unable    Fall History: # of falls in the past 6 months? 0 # of "near" falls in the past 6 months? 0    CURRENT SEATING / MOBILITY:  Current Mobility Device: [] None [] Cane/Walker [x] Manual [] Dependent [] Dependent w/ Tilt rScooter [] Power (type of control):   Manufacturer: Convaid Model: Cruiser Serial #:   Size: 16"x14" Color: Red Age: 58 years  Purchased by whom: patient  Current condition of mobility base:  shows normal wear and tear for its' age  Current seating system:                                                                       Age of seating system:    Describe posture in present  seating system: No postural deviations  in current system   Is the current mobility meeting medical necessity?:  [x] Yes [] No Describe:    Ability to complete Mobility-Related Activities of Daily Living (MRADL's) with Current Mobility Device:   Move room to room  [] Independent  [] Min [] Mod [] Max assist  [x] Unable  Comments: Patient is dependent for all mobility.  Meal prep  [] Independent  [] Min [] Mod [] Max assist  [x] Unable    Feeding  [] Independent  [x] Min [] Mod [] Max assist  [] Unable    Bathing  [] Independent  [] Min [] Mod [x] Max assist  [] Unable    Grooming  [] Independent  [] Min [] Mod [x] Max assist  [] Unable    UE dressing  [] Independent  [] Min [] Mod [x] Max assist  [] Unable    LE dressing  [] Independent   [] Min [] Mod [x] Max assist  [] Unable    Toileting  [] Independent  [] Min [] Mod [] Max assist  [x] Unable    Bowel Mgt: []  Continent [x]  Incontinent []  Accidents [x]  Diapers []  Colostomy []  Bowel Program:  Bladder Mgt: []  Continent [x]  Incontinent []  Accidents [x]  Diapers []  Urinal []  Intermittent Cath []  Indwelling Cath []  Supra-pubic Cath     Current Mobility Equipment Trialed/ Ruled Out:    Does not meet mobility needs due to:    Mark all boxes that indicate inability to use the specific equipment listed     Meets needs for safe  independent functional  ambulation  / mobility    Risk of  Falling or History of Falls    Enviromental limitations      Cognition    Safety concerns with  physical ability    Decreased / limitations endurance  & strength     Decreased / limitations  motor skills  & coordination    Pain    Pace /  Speed    Cardiac and/or  respiratory condition    Contra - indicated by diagnosis   Cane/Crutches  []   [x]   []   [x]   [x]   [x]   [x]   []   []   []   [x]    Walker / Rollator  []  NA   []   [x]   []   [x]   [x]   [x]   [x]   []   [x]   [x]   []     Manual Wheelchair [id number]:  []  NA  [x]   []   []   []   []   []   []   []   []   []   []    Manual W/C (K0005) with power assist  [x]   NA  []   []   []   []   []   []   []   []   []   []   []    Scooter  [x]  NA  []   []   []   []   []   []   []   []   []   []   []    Power Wheelchair: standard joystick  [x]  NA  []   []   []   []   []   []   []   []   []   []   []    Power Wheelchair: alternative controls  [x]  NA  []   []   []   []   []   []   []   []   []   []   []    Summary:  The least costly alternative for independent functional mobility was found to be:    []  Crutch/Cane  []  Walker [x]  Manual w/c  []  Manual w/c with power assist   []  Scooter   []  Power w/c std joystick   []  Power w/c alternative control        [x]  Requires dependent care mobility device   Functional Processing  Skills for [employer] are adequate for safe mobility equipment operation  []   Yes [x]   No  Patient is willing and motivated to use recommended mobility equipment  []   Yes [x]   No       [x]  Patient is unable to safely operate mobility equipment independently and requires dependent care equipment Comments: Patient has diagnosis of muscular dystrophy, cerebral palsy, and mental retardation and is therefore dependent in all of her care and for her mobility with use of a manual wheelchair.          SENSATION and SKIN ISSUES:  Sensation [x]  Intact  []  Impaired []  Absent []  Hyposensate []  Hypersensate  []  Defensiveness  Location(s) of impairment:    Pressure Relief Method(s):  []  Lean side to side to offload (without risk of falling)  []   W/C push up (4+ times/hour for 15+ seconds) [x]  Stand up (without risk of falling)    []  Other: (Describe): Effective pressure relief method(s) above can be performed consistently throughout the day: Yes  r No If not, Why?:  Skin Integrity Risk:       []  Low risk           [x]  Moderate risk            []  High risk  If high risk, explain:   Skin Issues/Skin Integrity  Current skin Issues  []  Yes [x]  No []  Intact  []   Red area   []   Open area  []  Scar tissue  []  At risk from prolonged sitting  Where: History of Skin Issues   []  Yes [x]  No Where : When: Stage: Hx of skin flap surgeries  []  Yes [x]  No Where:  When:  Pain: []  Yes [x]  No   Pain Location(s):  Intensity scale: (0-10) : How does pain interfere with mobility and/or MRADLs? -    MAT EVALUATION:  Neuro-Muscular Status: (Tone, Reflexive, Responses, etc.)     []   Intact   [x]  Spasticity:  [x]  Hypotonicity  [x]  Fluctuating  []  Muscle Spasms  [x]  Poor Righting Reactions/Poor Equilibrium Reactions  []  Primal Reflex(s):    Comments: history of muscle spasticity due to diagnosis of cerebral palsy           COMMENTS:    POSTURE:     Comments:  Pelvis Anterior/Posterior:  []  Neutral   [x]  Posterior  []  Anterior  []  Fixed - No movement [x]  Tendency away from neutral []  Flexible []  Self-correction []  External correction Obliquity (viewed from front)  [x]  WFL []  R Obliquity []  L Obliquity  []  Fixed - No movement []  Tendency away from neutral []  Flexible []  Self-correction []  External correction Rotation  [x]  WFL []  R anterior []  L anterior  []  Fixed - No movement []  Tendency away from neutral []  Flexible []  Self-correction []  External correction Tonal Influence Pelvis:  []  Normal []  Flaccid [x]  Low tone [x]  Spasticity []  Dystonia []  Pelvis thrust []  Other:    Trunk Anterior/Posterior:  []  WFL [x]  Thoracic kyphosis []  Lumbar lordosis  []  Fixed - No movement [x]  Tendency away from neutral []  Flexible []  Self-correction []  External correction  [x]  WFL []  Convex to left  []  Convex to right []  S-curve   []  C-curve []  Multiple curves []  Tendency away from neutral []  Flexible []  Self-correction []  External correction Rotation of shoulders and upper trunk:  [x]  Neutral []  Left-anterior []  Right- anterior []  Fixed- no movement []  Tendency away from neutral []  Flexible []   Self correction []  External correction Tonal influence Trunk:  []  Normal []  Flaccid []  Low tone [x]  Spasticity []  Dystonia []   Other:   Head & Neck  []  Functional [x]  Flexed    []  Extended []  Rotated right  []  Rotated left []  Laterally flexed right []  Laterally flexed left []  Cervical hyperextension   []  Good head control []  Adequate head control [x]  Limited head control []  Absent head control Describe tone/movement of head and neck: dystonia/chorea movements of the head   Lower Extremity Measurements: LE ROM:  Passive ROM Right 01/06/2023 Left 01/06/2023  Hip flexion [facility]  Hip extension    Hip abduction    Hip adduction    Knee flexion [facility] WFL  Knee extension [facility] WFL  Ankle dorsiflexion Decreased  Decreased   Ankle plantarflexion     (Blank rows = not tested) Hip positions:  [x]  Neutral   []  Abducted   []  Adducted  []  Subluxed   []  Dislocated   []  Fixed   []  Tendency away from neutral []  Flexible []  Self-correction []  External correction   Hip Windswept:[x]  Neutral  []  Right    []  Left  []  Subluxed   []  Dislocated   []  Fixed   []  Tendency away from neutral []  Flexible []  Self-correction []  External correction  LE Tone: []  Normal [x]  Low tone []  Spasticity []  Flaccid []  Dystonia []  Rocks/Extends at hip []  Thrust into knee extension []  Pushes legs downward into footrest  Foot positioning: ROM Concerns: Dorsiflexed: []  Right   []  Left Plantar flexed: [x]  Right    [x]  Left Inversion: []  Right    []  Left Eversion: []  Right    []  Left  UE Measurements:  UPPER EXTREMITY ROM:   Passive ROM Right 01/06/2023 Left 01/06/2023  Shoulder flexion [facility]  Shoulder abduction [facility]  Shoulder adduction    Elbow flexion WFL WFL  Elbow extension WFL WFL  Wrist flexion WFL WFL  Wrist extension WFL WFL  (Blank rows = not tested)  Shoulder Posture:  Right Tendency towards Left  []   Functional []    []   Elevation []    [x]   Depression [x]    [x]   Protraction [x]    []   Retraction []    [x]   Internal rotation [x]    []   External rotation []    []   Subluxed []     UE Tone: []   Normal []  Flaccid [x]  Low tone []  Spasticity  []  Dystonia []  Other:  Wrist/Hand: Handedness: []  Right   []  Left   [x]  NA: Comments:  Right  Left  []   WNL []    []   Limitations []    []   Contractures []    []   Fisting []    []   Tremors []    [x]   Weak grasp [x]    [x]   Poor dexterity [x]    []   Hand movement non functional []    []   Paralysis []     MOBILITY BASE RECOMMENDATIONS and JUSTIFICATION:  MOBILITY BASE  JUSTIFICATION   Manufacturer:   Convaide Model: EZ Rider                             Color: Green Seat Width:  14" Seat Depth: 16"    [x]  Manual mobility base (continue below)   []  Scooter/POV  []  Power mobility base   Number of hours per day spent in above selected mobility base: 12  Typical daily mobility base use Schedule: Patient spends at  minimum 12 hours per day in her wheelchair. She is able to stand frequently throughout the day to perform pressure relief but is non-ambulatory and therefore requires the stroller for dependent mobility. Due to her diagnoses of muscular dystrophy, cerebral palsy, and due to her significant cognitive deficits as well as her body habitus she is unable to propel a wheelchair independently and relies on caregivers to transport her dependently via stroller.    [x]  is not a safe, functional ambulator  [x]  limitation prevents from completing a MRADL(s) within a reasonable time frame    [x]  limitation places at high risk of morbidity or mortality secondary to  the attempts to perform a    MRADL(s)  [x]  limitation prevents accomplishing a MRADL(s) entirely  []  provide independent mobility  [x]  equipment is a lifetime medical need  [x]  walker or cane inadequate  [x]  any type manual wheelchair      inadequate  []  scooter/POV inadequate      [x]  requires dependent mobility        []  Power assist Comments:  []  prevent repetitive use injuries  []  repetitive strain injury present in    shoulder girdle    []  shoulder pain is (> or =) to 7/10      during manual propulsion       Current Pain _____/10  []  requires conservation of energy to participate in MRADL(s) runable to propel up ramps or curbs using manual wheelchair  []  been K0005 user greater than one year  []  user unwilling to use power      wheelchair (reason): []  less expensive option to power   wheelchair   []  rim activated power assist -      decreased strength   []  Heavy duty manual wheelchair       K0006     Arm:    []  both []  right  []  left     Foot:   []  both []  right  []  left     []  hemi height required    []  Dependent base  []  user exceeds 250lbs  []  non-functional ambulator    []  extreme spasticity  []  over active movement   []  broken frame/hx of repeated     repairs  []  able to self-propel in residence       []  lower seat to floor height required  []  unable to self-propel in residence   []  Extra heavy duty manual wheelchair  K0007     Arm:    []  both []  right  []  left     Foot:   []  both []  right  []  left     []  hemi height required  []  Dependent base  []  user exceeds 300lbs  []  non-functional ambulator    []  able to self-propel in residence   []  lower seat to floor height required  []  unable to self-propel in residence     []  Manual wheelchair with tilt [id number]      (Manual "Tilt-n-Space")  []  patient is dependent for transfers  []  patient requires frequent       positioning for pressure relief   []  patient requires frequent      positioning for poor/absent trunk control        [x]  Stroller Base  []  infant/child   [x]  unable to propel manual      wheelchair  []  allows for growth  [x]  non-functional ambulator  [x]  non-functional UE  [x]  independent mobility is not a goal  at this time    MANUAL FRAME OPTIONS      Push handles  []  extended  rangle adjustable   []  standard  []  caregiver access  []  caregiver assist    []  allows "hooking" to enable      increased ability to perform ADLs or maintain balance   []  Angle Adjustable Back  []  postural  control  []  control of tone/spasticity  []  accommodation of range of motion  []  UE functional control  []  accommodation for seating system    Rear wheel placement  []  std/fixed rfully adjustableramputee   []  camber ________degree  []  removable rear wheel  []  non-removable rear wheel  Wheel size _______  Wheel style_______________________  []  improved UE access to wheels  []  increase propulsion ability  []  improved stability  []  changing angle in space for      improvement of postural stability  []  remove for transport    []  allow for seating system to fit on      base  []  amputee placement  []  1-arm drive access   r R  r L  []  enable propulsion of manual       wheelchair with one arm    []  amputee placement   Wheel rims/ Hand rims  []  Standard    []  Specialized-____ []  provide ability to propel manual   []  increase self-propulsion with hand wheelchair weakness/decreased grasp     []  Spoke protector/guard   []  prevent hands from getting caught in spokes   Tires:  []  pneumatic  []  flat free inserts  []  solid  Style:   []  decrease roll resistance              []  prevent frequent flats  []  increase shock absorbency  []  decrease maintenance   []  decrease pain from road shock    []  decrease spasms from road shock    Wheel Locks:    []  push []  pull []  scissor  []  lock wheels for transfers  []  lock wheels from rolling   Brake/wheel lock extension:  []  R  []  L  []  allow user to operate wheel locks due to decreased reach or strength   Caster housing:  Caster size:                      Style:                                          []  suspension fork  []  maneuverability   []  stability of wheelchair   []  durability  []  maintenance  []  angle adjustment for posture  []  allow for feet to come under        wheelchair base  []  allows change in seat to floor      height   []  increase shock absorbency  []  decrease pain from road shock  []  decrease spasms from road    shock   []   Side guards  []  prevent clothing getting caught in      wheel or becoming soiled  rprovide hip and pelvic stability  []  eliminates contact between body and wheels  []  limit hand contact with wheels   []  Anti-tippers      []  prevent wheelchair from tipping    backward  []  assist caregiver with curbs    Hangers/ Legrests   []   ______ degree  []  Elevating []  articulating  []  swing away []  fixed []  lift off  []  heavy duty []  adjustable knee angle  []  adjustable calf panel   []  longer extension tube              []  provide LE support  []  maintain placement of feet on      footplate   []  accommodate lower leg length  []  accommodate to hamstring       tightness  []  enable transfers  []  provide change in position for LE's  []  elevate legs during recline    []  decrease edema  []  durability      Foot support   []  footplate []  R []  L []  flip up           []  Depth adjustable   []  angle adjustable  []  foot board/one piece    []  provide foot support  []  accommodate to ankle ROM  []  allow foot to go under wheelchair base  []  enable transfers     []  Shoe holders  []  position foot    []  decrease / manage spasticity  []  control position of LE  []  stability    []  safety     []  Ankle strap/heel      loops  []  support foot on foot support  []  decrease extraneous movement  []  provide input to heel   []  protect foot     []  Amputee adapter []  R  []  L     Style:                  Size:  []  Provide support for stump/residual extremity    [x]  Transportation tie-down  [x]  to provide crash tested tie-down brackets    []  Crutch/cane holder    []  O2 holder    []  IV hanger   []  Ventilator tray/mount    []  stabilize accessory on wheelchair       Component  Justification     []  Seat cushion      []  accommodate impaired sensation  []  decubitus ulcers present or history  []  unable to shift weight  []  increase pressure distribution  []  prevent pelvic extension  []  custom required "off-the-shelf"    seat  cushion will not accommodate deformity  []  stabilize/promote pelvis alignment  []  stabilize/promote femur alignment  []  accommodate obliquity  []  accommodate multiple deformity  []  incontinent/accidents  []  low maintenance     []  seat mounts                 []  fixed []  removable  []  attach seat platform/cushion to wheelchair frame    []  Seat wedge    []  provide increased aggressiveness of seat shape to decrease sliding  down in the seat  []  accommodate ROM        []  Cover replacement   []  protect back or seat cushion  []  incontinent/accidents    []  Solid seat / insert    []  support cushion to prevent      hammocking  []  allows attachment of cushion to mobility base    []  Lateral pelvic/thigh/hip     support (Guides)     []  decrease abduction  []  accommodate pelvis  []  position upper legs  []  accommodate spasticity  []  removable for transfers     []  Lateral pelvic/thigh      supports mounts  []  fixed   []  swing-away   []  removable  []   mounts lateral pelvic/thigh supports     []  mounts lateral pelvic/thigh supports swing-away or removable for transfers    []  Medial thigh support ([place])  rdecrease adduction raccommodate ROM  rremove for transfers    ralignment      []  Medial thigh   []  fixed      support mounts      []  swing-away   []  removable  []  mounts medial thigh supports   []  Mounts medial supports swing- away or removable for transfers     Component  Justification   []  Back       []  provide posterior trunk support []  facilitate tone  []  provide lumbar/sacral support []  accommodate deformity  []  support trunk in midline   []  custom required "off-the-shelf" back support will not accommodate deformity   []  provide lateral trunk support []  accommodate or decrease tone            []  Back mounts  []  fixed  []  removable  []  attach back rest/cushion to wheelchair frame   []  Lateral trunk      supports  []  R []  L  []  decrease lateral trunk leaning  []  accommodate asymmetry     []  contour for increased contact  []  safety    []  control of tone    []  Lateral trunk      supports mounts  []  fixed  []  swing-away   []  removable  []  mounts lateral trunk supports     []  Mounts lateral trunk supports swing-away or removable for transfers   []  Anterior chest      strap, vest     []  decrease forward movement of shoulder  []  decrease forward movement of trunk  []  safety/stability  []  added abdominal support  []  trunk alignment  []  assistance with shoulder control   []  decrease shoulder elevation    []  Headrest       []  provide posterior head support  []  provide posterior neck support  []  provide lateral head support  []  provide anterior head support  []  support during tilt and recline  []  improve feeding     []  improve respiration  []  placement of switches  []  safety    []  accommodate ROM   []  accommodate tone  []  improve visual orientation   []  Headrest           []  fixed []  removable []  flip down      Mounting hardware   []  swing-away laterals/switches  []  mount headrest   []  mounts headrest flip down or  removable for transfers  []  mount headrest swing-away laterals   []  mount switches     []  Neck Support    []  decrease neck rotation  []  decrease forward neck flexion   Pelvic Positioner    []  std hip belt          []  padded hip belt  []  dual pull hip belt  []  four point hip belt  [employer] []  stabilize tone  [x]  decrease falling out of chair  []  prevent excessive extension  []  special pull angle to control      rotation  []  pad for protection over boney      prominence  []  promote comfort    []  Essential needs        bag/pouch   []  medicines []  special food rorthotics []  clothing changes []  diapers   []  catheter/hygiene []  ostomy supplies   The above equipment has  a life- long use expectancy.  Growth and changes in medical and/or functional conditions would be the exceptions.   SUMMARY:  Why mobility device was selected; include why a  lower level device is not appropriate:   ASSESSMENT:  CLINICAL IMPRESSION: Patient is a 47 y.o. female who was seen today for physical therapy evaluation for a new stroller. She is dependent for all mobility and is at a high risk for falling while ambulating given her multiple neurological diagnoses and therefore requires a custom stroller to allow caregivers to safely assist her with dependent mobility.  The patient and her family has been relying on a custom stroller for the entirety of the patients life due to her multiple neurological diagnoses. She is at a very high risk for falling and sustaining injuries and thus is safest utilizing the stroller. Due to bilateral upper extremity weakness, low tone with fluctuating spasticity, cognitive deficits and decreased vision, she is unable to safely propel even a light weight manual wheelchair.  She will require a canopy for the stroller to minimize her exposure to the sun and increased heat. 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  The patient and the family will require a stroller travel bag to allow for the patient to travel with her stroller and allow her participate in family events and engage in social interactions. This is imperative for the patients mental health and general cognitive stimulation.  A five-point harness with assure that the patient remains safely seated in the stroller throughout the entirety of its use. She demonstrates delayed righting reactions and does have fluctuating truncal tone (hypotonicity with intermittent spasticity) that puts the patient at an increased risk for falling out of the stroller.  The patients need for the [employer] is a life-long need given her chronic diagnoses of muscular dystrophy and cerebral palsy. She is not able to safely, nor effectively, propel even a light weight manual wheelchair and thus requires dependent  mobility in a stroller.   OBJECTIVE IMPAIRMENTS cardiopulmonary status limiting activity, decreased activity tolerance, decreased balance, decreased cognition, decreased coordination, decreased endurance, decreased mobility, difficulty walking, decreased safety awareness, increased muscle spasms, impaired tone, impaired UE functional use, improper body mechanics, and postural dysfunction.   ACTIVITY LIMITATIONS carrying, lifting, bending, sitting, standing, squatting, stairs, transfers, bed mobility, continence, bathing, toileting, dressing, self feeding, reach over head, and hygiene/grooming  PARTICIPATION LIMITATIONS: meal prep, cleaning, laundry, medication management, personal finances, interpersonal relationship, driving, shopping, and community activity  PERSONAL FACTORS Time since onset of injury/illness/exacerbation and 3+ comorbidities: see below:  anemia, CP, GERD, glaucoma, mental retardation, muscular dystrophy, pre-diabetesare also affecting patient's functional outcome.   REHAB POTENTIAL: Good  CLINICAL DECISION MAKING: Stable/uncomplicated  EVALUATION COMPLEXITY: High    GOALS: One time visit. No goals established.    PLAN: PT FREQUENCY: one time visit    Westley Foots, PT, DPT, CBIS 01/06/2023, 9:47 AM    I concur with the above findings and recommendations of the therapist:  Physician name printed:         Physician's signature:      Date:

## 2024-05-06 ENCOUNTER — Emergency Department (HOSPITAL_BASED_OUTPATIENT_CLINIC_OR_DEPARTMENT_OTHER)

## 2024-05-06 ENCOUNTER — Encounter (HOSPITAL_BASED_OUTPATIENT_CLINIC_OR_DEPARTMENT_OTHER): Payer: Self-pay | Admitting: Emergency Medicine

## 2024-05-06 ENCOUNTER — Emergency Department (HOSPITAL_BASED_OUTPATIENT_CLINIC_OR_DEPARTMENT_OTHER)
Admission: EM | Admit: 2024-05-06 | Discharge: 2024-05-06 | Disposition: A | Discharge status: Home / Self Care | Attending: Emergency Medicine | Admitting: Emergency Medicine

## 2024-05-06 ENCOUNTER — Other Ambulatory Visit: Payer: Self-pay

## 2024-05-06 DIAGNOSIS — K5900 Constipation, unspecified: Secondary | ICD-10-CM | POA: Diagnosis present

## 2024-05-06 MED ORDER — MIDAZOLAM HCL 2 MG/ML PO SYRP
2.0000 mg | ORAL_SOLUTION | Freq: Once | ORAL | Status: AC
  Administered 2024-05-06: 2 mg via ORAL
  Filled 2024-05-06 (×2): qty 5

## 2024-05-06 MED ORDER — LORAZEPAM 2 MG/ML PO CONC: 0.5000 mg | Freq: Once | ORAL | Status: DC

## 2024-05-06 NOTE — ED Triage Notes (Addendum)
 Brought in by mom Not acting normal, pain, not moving like normal Not eat like normal Started Thursday getting worse Non verbal

## 2024-05-06 NOTE — ED Notes (Signed)
 IV attempted x2 left hand and left AC... Provider aware of negative result from IV sticks.... Provider requested to hold on further sticks until she could talk to the family.SABRASABRA

## 2024-05-06 NOTE — ED Provider Notes (Signed)
 [place] [facility]PARTMENT AT [facility] Provider Note   CSN: [id number] Arrival date & time: 05/06/24  1545     Patient presents with: No chief complaint on file.   Maria [name] is a 48 y.o. female.   Patient with history of spina bifida, MD, cerebral palsy, nonverbal, to ED with mom who reports that over the last 3-4 days she has had a change in her behavior described as not eating and drinking per her usual, acting like she is in pain when moved to change or clean her. Per mom, she does not walk but does crawl so is usually active and mobile. She is less mobile currently. No known injury or fall. No wounds or bleeding. No fever. She is not vomiting. No cough or congestion. Mom denies any malodor to her urine to suggest UTI.   The history is provided by a parent.       Prior to Admission medications   Medication Sig Start Date End Date Taking? Authorizing Provider  baclofen (LIORESAL) 10 MG tablet Take 5 mg by mouth 3 (three) times daily as needed for muscle spasms.    [provider]  Cholecalciferol (VITAMIN D) 125 MCG (5000 UT) CAPS Take 5,000 Units by mouth daily.    [provider]  dicyclomine  (BENTYL ) 20 MG tablet Take 0.5 tablets (10 mg total) by mouth 2 (two) times daily as needed for spasms (and belly pain). 02/14/20   Harris, Abigail, PA-C  ferrous sulfate  325 (65 FE) MG tablet Take 325 mg by mouth daily with breakfast. Crushes it for patient    [provider]  ibuprofen  (ADVIL ) 100 MG/5ML suspension Take 30 mLs (600 mg total) by mouth every 6 (six) hours as needed for mild pain or moderate pain. 09/10/20   Delana Ted Morrison, DO  latanoprost  (XALATAN ) 0.005 % ophthalmic solution Place 1 drop into the left eye at bedtime. 02/18/15   [provider]  magnesium  citrate SOLN Take 0.25 Bottles by mouth daily as needed for moderate constipation.     [provider]  Multiple Vitamin (MULTIVITAMIN WITH MINERALS) TABS  tablet Take 1 tablet by mouth daily.    [provider]  polyethylene glycol (MIRALAX ) 17 g packet Take 17 g by mouth in the morning, at noon, and at bedtime. Please take 1 scoop 3 times a day for 7 days; then 1 scoop once a day therafter 04/09/20   Vonn Hadassah LABOR, PA-C  simvastatin  (ZOCOR ) 40 MG tablet Take 40 mg by mouth daily.    [provider]  tobramycin -dexamethasone  (TOBRADEX ) ophthalmic solution Place 1 drop into both eyes every 4 (four) hours while awake. 02/19/22   Logan Ubaldo NOVAK, PA-C    Allergies: Patient has no known allergies.    Review of Systems  Updated Vital Signs BP (!) 164/109   Pulse 79   Temp 98.7 F (37.1 C) (Oral)   Resp 20   Wt 21.9 kg   LMP 08/08/2020   SpO2 100%   BMI 14.73 kg/m   Physical Exam Vitals and nursing note reviewed.  Constitutional:      Appearance: She is not toxic-appearing.  HENT:     Head:     Comments: Microcephaly    Mouth/Throat:     Mouth: Mucous membranes are moist.  Cardiovascular:     Rate and Rhythm: Normal rate and regular rhythm.  Pulmonary:     Effort: Pulmonary effort is normal.     Breath sounds: No wheezing, rhonchi  or rales.  Abdominal:     General: There is no distension.     Palpations: Abdomen is soft.  Musculoskeletal:     Comments: Moving all extremities. No acute deformities. Uncomfortable appearing with movement of lower extremities, however, exam limited.  Skin:    General: Skin is warm and dry.     Findings: No bruising or erythema.  Neurological:     Mental Status: She is alert.     (all labs ordered are listed, but only abnormal results are displayed) Labs Reviewed  CBC WITH DIFFERENTIAL/PLATELET  COMPREHENSIVE METABOLIC PANEL WITH GFR    EKG: None  Radiology: DG Tibia/Fibula Left Result Date: 05/06/2024 CLINICAL DATA:  Pain EXAM: LEFT TIBIA AND FIBULA - 2 VIEW COMPARISON:  None Available. FINDINGS: There is no evidence of fracture or other focal bone lesions. Soft  tissues are atrophic otherwise unremarkable. IMPRESSION: Negative. Electronically Signed   By: Megan  Zare M.D.   On: 05/06/2024 18:21   DG Pelvis 1-2 Views Result Date: 05/06/2024 CLINICAL DATA:  Lower extremity pain EXAM: PELVIS - 1-2 VIEW COMPARISON:  Pelvic MRI 05/28/2020 FINDINGS: Prominent stool throughout the colon favors constipation. Imaging performed with the hip substantially abducted. No acute bony findings. IMPRESSION: 1. Prominent stool throughout the colon favors constipation. Electronically Signed   By: Ryan Salvage M.D.   On: 05/06/2024 18:20   DG Foot 2 Views Right Result Date: 05/06/2024 CLINICAL DATA:  Pain EXAM: RIGHT FOOT - 2 VIEW COMPARISON:  None Available. FINDINGS: The bones are mildly osteopenic. There is no evidence of fracture or dislocation. Mild hallux valgus present. Soft tissues are unremarkable. IMPRESSION: Mild osteopenia. No acute findings. Electronically Signed   By: Greig Pique M.D.   On: 05/06/2024 18:20   DG Foot 2 Views Left Result Date: 05/06/2024 CLINICAL DATA:  Pain EXAM: LEFT FOOT - 2 VIEW COMPARISON:  None Available. FINDINGS: The bones are mildly osteopenic. There is no evidence of fracture or dislocation. There is no evidence of arthropathy or other focal bone abnormality. Soft tissues are unremarkable. IMPRESSION: Mild osteopenia. No acute findings. Electronically Signed   By: Greig Pique M.D.   On: 05/06/2024 18:19   DG Tibia/Fibula Right Result Date: 05/06/2024 CLINICAL DATA:  Pain EXAM: RIGHT TIBIA AND FIBULA - 2 VIEW COMPARISON:  None Available. FINDINGS: There is no evidence of fracture or other focal bone lesions. Soft tissues are unremarkable. IMPRESSION: Negative. Electronically Signed   By: Greig Pique M.D.   On: 05/06/2024 18:19   DG Knee 2 Views Left Result Date: 05/06/2024 CLINICAL DATA:  Pain. EXAM: LEFT KNEE - 1-2 VIEW COMPARISON:  None Available. FINDINGS: No evidence of fracture, dislocation, or joint effusion. No evidence of  arthropathy or other focal bone abnormality. Soft tissues are unremarkable. IMPRESSION: Negative. Electronically Signed   By: Greig Pique M.D.   On: 05/06/2024 18:18   DG Femur Min 2 Views Right Result Date: 05/06/2024 CLINICAL DATA:  Pain EXAM: RIGHT FEMUR 2 VIEWS COMPARISON:  None Available. FINDINGS: The bones are osteopenic. There is no evidence of fracture or other focal bone lesions. Soft tissues are unremarkable. IMPRESSION: Osteopenia. No acute fracture or dislocation. Electronically Signed   By: Greig Pique M.D.   On: 05/06/2024 18:16   DG Femur Min 2 Views Left Result Date: 05/06/2024 CLINICAL DATA:  Pain EXAM: LEFT FEMUR 2 VIEWS COMPARISON:  None Available. FINDINGS: The bones are osteopenic. There is no evidence of fracture or other focal bone lesions. Soft tissues are unremarkable.  IMPRESSION: Osteopenia. No acute fracture. Electronically Signed   By: Greig Pique M.D.   On: 05/06/2024 18:16     Procedures   Medications Ordered in the ED  midazolam  (VERSED ) 2 MG/ML syrup 2 mg (2 mg Oral Given 05/06/24 1742)    Clinical Course as of 05/06/24 1913  Mon May 06, 2024  1907 Patient BIB mom with concern for injury to lower extremities or hips as she has appeared uncomfortable with movement over the last 3-4 days. No vomiting but her oral intake is decreased. No fever.   Xrays negative for bony injury. There is evidence of constipation. Blood specimens attempted but unsuccessful. I discussed further work up with mom who states she is comfortable with taking her home and treating constipation with Miralax  which she has used in the past. Discussed that her exam today was limited and constipation was only suspected, and mom states she will return with any new or concerning symptoms such as fever, vomiting, increased appearance of discomfort. Mom is felt reliable to return with any new event.  [SU]    Clinical Course User Index [SU] Odell Balls, PA-C                                  Medical Decision Making Amount and/or Complexity of Data Reviewed Labs: ordered. Radiology: ordered.  Risk Prescription drug management.        Final diagnoses:  Constipation, unspecified constipation type    ED Discharge Orders     None          Odell Balls RIGGERS 05/06/24 1913    Jerrol Agent, MD 05/06/24 2202

## 2024-05-06 NOTE — ED Notes (Signed)
 Pt given discharge instructions. Opportunities given for questions. IV removed. Pt stable at time of discharge.

## 2024-05-06 NOTE — Discharge Instructions (Signed)
 As we discussed, use Miralax  once daily to relieve constipation. Please see your doctor in the next 2-3 days for recheck.   It is important to return to the ED with any new or concerning symptoms as we only suspect constipation as the cause of her symptoms.

## 2024-07-23 ENCOUNTER — Other Ambulatory Visit: Payer: Self-pay | Admitting: Family Medicine

## 2024-07-23 DIAGNOSIS — Z993 Dependence on wheelchair: Secondary | ICD-10-CM

## 2024-07-23 DIAGNOSIS — F8189 Other developmental disorders of scholastic skills: Secondary | ICD-10-CM

## 2024-07-23 DIAGNOSIS — R262 Difficulty in walking, not elsewhere classified: Secondary | ICD-10-CM

## 2024-07-23 DIAGNOSIS — Z1239 Encounter for other screening for malignant neoplasm of breast: Secondary | ICD-10-CM

## 2024-08-12 ENCOUNTER — Other Ambulatory Visit (HOSPITAL_COMMUNITY): Payer: Self-pay | Admitting: Family Medicine

## 2024-08-12 DIAGNOSIS — Z1239 Encounter for other screening for malignant neoplasm of breast: Secondary | ICD-10-CM

## 2024-08-21 ENCOUNTER — Ambulatory Visit (HOSPITAL_COMMUNITY)
Admission: RE | Admit: 2024-08-21 | Discharge: 2024-08-21 | Disposition: A | Source: Ambulatory Visit | Attending: Family Medicine | Admitting: Family Medicine

## 2024-08-21 ENCOUNTER — Encounter (HOSPITAL_COMMUNITY): Payer: Self-pay

## 2024-08-21 DIAGNOSIS — Z1239 Encounter for other screening for malignant neoplasm of breast: Secondary | ICD-10-CM

## 2024-10-04 ENCOUNTER — Telehealth: Payer: Self-pay

## 2024-10-04 NOTE — Telephone Encounter (Signed)
 Pt mom Bernarda made aware of providers recommendations: Pt was scheduled to see Deanna May NP on 10/11/2024 at 3:20 PM. Bernarda made aware. Bernarda verbalized understanding with all questions answered.

## 2024-10-04 NOTE — Telephone Encounter (Signed)
 Would you like this pt to have an office visit with you or an APP prior to the Hospital procedure or would you like to proceed with a previsit appointment only with the previsit nurse prior to the procedure.  Please review and advise

## 2024-10-04 NOTE — Telephone Encounter (Signed)
 Call to discuss upcoming previsit and when I spoke with legal guardian(pt mother) she states pt is wheelchair bound and unable to stand and get on a gurney therefore I explained to Mom that pt will need to be a hospital procedure as our facility is outpt ambulatory facility and I would have to let the Dr know, that pt will need hospital procedure and possibly office visit first, Mom verb understanding

## 2024-10-07 ENCOUNTER — Encounter

## 2024-10-11 ENCOUNTER — Ambulatory Visit: Admitting: Gastroenterology

## 2024-10-11 NOTE — Progress Notes (Deleted)
 Maria [name]

## 2024-10-14 ENCOUNTER — Encounter: Admitting: Pediatrics

## 2025-02-13 ENCOUNTER — Other Ambulatory Visit (HOSPITAL_COMMUNITY)

## 2025-02-13 ENCOUNTER — Ambulatory Visit (HOSPITAL_COMMUNITY): Admit: 2025-02-13

## 2025-02-13 SURGERY — MRI WITH ANESTHESIA
Anesthesia: General | Laterality: Bilateral
